# Patient Record
Sex: Female | Born: 1980 | Race: Black or African American | Hispanic: No | Marital: Married | Smoking: Never smoker
Health system: Southern US, Community
[De-identification: ages and names within clinical notes are randomized; demographics above are authoritative.]

## PROBLEM LIST (undated history)

## (undated) ENCOUNTER — Inpatient Hospital Stay (HOSPITAL_COMMUNITY): Payer: Self-pay

## (undated) DIAGNOSIS — O139 Gestational [pregnancy-induced] hypertension without significant proteinuria, unspecified trimester: Secondary | ICD-10-CM

## (undated) DIAGNOSIS — O09899 Supervision of other high risk pregnancies, unspecified trimester: Secondary | ICD-10-CM

## (undated) DIAGNOSIS — D219 Benign neoplasm of connective and other soft tissue, unspecified: Secondary | ICD-10-CM

## (undated) HISTORY — PX: WISDOM TOOTH EXTRACTION: SHX21

---

## 2012-09-20 ENCOUNTER — Ambulatory Visit (INDEPENDENT_AMBULATORY_CARE_PROVIDER_SITE_OTHER): Payer: BC Managed Care – PPO | Admitting: Family Medicine

## 2012-09-20 VITALS — BP 140/72 | HR 80 | Temp 98.9°F | Resp 18 | Ht 66.0 in | Wt 186.0 lb

## 2012-09-20 DIAGNOSIS — L293 Anogenital pruritus, unspecified: Secondary | ICD-10-CM

## 2012-09-20 DIAGNOSIS — N898 Other specified noninflammatory disorders of vagina: Secondary | ICD-10-CM

## 2012-09-20 LAB — POCT WET PREP WITH KOH
Clue Cells Wet Prep HPF POC: 100
KOH Prep POC: POSITIVE
Trichomonas, UA: NEGATIVE
Yeast Wet Prep HPF POC: NEGATIVE

## 2012-09-20 MED ORDER — FLUCONAZOLE 150 MG PO TABS: 150.0000 mg | ORAL_TABLET | Freq: Once | ORAL | Status: DC

## 2012-09-20 NOTE — Progress Notes (Signed)
31 yo married, monogamous woman with vaginal itching x 2 days.  Having a milky type discharge without odor.  LMP ended 12/15 G1P1  Objective:  NAD Vaginal exam:  White lumpy vaginal discharge, normal cervix without motion tenderness Results for orders placed in visit on 09/20/12  POCT WET PREP WITH KOH      Component Value Range   Trichomonas, UA Negative     Clue Cells Wet Prep HPF POC 100%     Epithelial Wet Prep HPF POC 0-8     Yeast Wet Prep HPF POC neg     Bacteria Wet Prep HPF POC 4+     RBC Wet Prep HPF POC 0-1     WBC Wet Prep HPF POC 0-1     KOH Prep POC Positive      1. Vaginal itching  POCT Wet Prep with KOH

## 2012-09-20 NOTE — Patient Instructions (Addendum)
Monilial Vaginitis Vaginitis in a soreness, swelling and redness (inflammation) of the vagina and vulva. Monilial vaginitis is not a sexually transmitted infection. CAUSES  Yeast vaginitis is caused by yeast (candida) that is normally found in your vagina. With a yeast infection, the candida has overgrown in number to a point that upsets the chemical balance. SYMPTOMS   White, thick vaginal discharge.  Swelling, itching, redness and irritation of the vagina and possibly the lips of the vagina (vulva).  Burning or painful urination.  Painful intercourse. DIAGNOSIS  Things that may contribute to monilial vaginitis are:  Postmenopausal and virginal states.  Pregnancy.  Infections.  Being tired, sick or stressed, especially if you had monilial vaginitis in the past.  Diabetes. Good control will help lower the chance.  Birth control pills.  Tight fitting garments.  Using bubble bath, feminine sprays, douches or deodorant tampons.  Taking certain medications that kill germs (antibiotics).  Sporadic recurrence can occur if you become ill. TREATMENT  Your caregiver will give you medication.  There are several kinds of anti monilial vaginal creams and suppositories specific for monilial vaginitis. For recurrent yeast infections, use a suppository or cream in the vagina 2 times a week, or as directed.  Anti-monilial or steroid cream for the itching or irritation of the vulva may also be used. Get your caregiver's permission.  Painting the vagina with methylene blue solution may help if the monilial cream does not work.  Eating yogurt may help prevent monilial vaginitis. HOME CARE INSTRUCTIONS   Finish all medication as prescribed.  Do not have sex until treatment is completed or after your caregiver tells you it is okay.  Take warm sitz baths.  Do not douche.  Do not use tampons, especially scented ones.  Wear cotton underwear.  Avoid tight pants and panty  hose.  Tell your sexual partner that you have a yeast infection. They should go to their caregiver if they have symptoms such as mild rash or itching.  Your sexual partner should be treated as well if your infection is difficult to eliminate.  Practice safer sex. Use condoms.  Some vaginal medications cause latex condoms to fail. Vaginal medications that harm condoms are:  Cleocin cream.  Butoconazole (Femstat).  Terconazole (Terazol) vaginal suppository.  Miconazole (Monistat) (may be purchased over the counter). SEEK MEDICAL CARE IF:   You have a temperature by mouth above 102 F (38.9 C).  The infection is getting worse after 2 days of treatment.  The infection is not getting better after 3 days of treatment.  You develop blisters in or around your vagina.  You develop vaginal bleeding, and it is not your menstrual period.  You have pain when you urinate.  You develop intestinal problems.  You have pain with sexual intercourse. Document Released: 07/01/2005 Document Revised: 12/14/2011 Document Reviewed: 03/15/2009 [employer] Patient Information 2013 [employer].  

## 2012-12-14 ENCOUNTER — Ambulatory Visit (INDEPENDENT_AMBULATORY_CARE_PROVIDER_SITE_OTHER): Payer: BC Managed Care – PPO | Admitting: Physician Assistant

## 2012-12-14 DIAGNOSIS — M25571 Pain in right ankle and joints of right foot: Secondary | ICD-10-CM

## 2012-12-14 DIAGNOSIS — S93409A Sprain of unspecified ligament of unspecified ankle, initial encounter: Secondary | ICD-10-CM

## 2012-12-14 MED ORDER — MELOXICAM 15 MG PO TABS: 15.0000 mg | ORAL_TABLET | Freq: Every day | ORAL | Status: DC

## 2012-12-14 NOTE — Progress Notes (Signed)
  Subjective:    Patient ID: Bridget Morales, female    DOB: Oct 23, 1980, 32 y.o.   MRN: [id number]  HPI   Bridget Morales is a very pleasant 32 yr old female with right ankle pain after playing kickball last night.  She does not remember injuring the ankle, but woke up around 1:45 this morning with pain in her foot and ankle.  Couldn't put pressure on the foot, didn't even want anything to touch it.  Feels like it is getting a little better now.  The right ankle is a little swollen, but no redness or bruising.  No ankle problems before.  No history of gout.  She is able to bear weight.  Pain is on the lateral aspect of the ankle.  Feels "tight" in her shin.  No knee pain.  Has not done anything for symptoms yet.     Review of Systems  Constitutional: Negative for fever and chills.  HENT: Negative.   Respiratory: Negative.   Cardiovascular: Negative.   Gastrointestinal: Negative.   Musculoskeletal: Positive for arthralgias (right ankle).  Skin: Negative.   Neurological: Negative.        Objective:   Physical Exam  Vitals reviewed. Constitutional: She is oriented to person, place, and time. She appears well-developed and well-nourished. No distress.  HENT:  Head: Normocephalic and atraumatic.  Eyes: Conjunctivae are normal. No scleral icterus.  Pulmonary/Chest: Effort normal.  Musculoskeletal:       Right knee: Normal.       Right ankle: She exhibits decreased range of motion and swelling (minimal). She exhibits no ecchymosis and no deformity. Tenderness. AITFL and CF ligament tenderness found. No lateral malleolus, no medial malleolus, no head of 5th metatarsal and no proximal fibula tenderness found. Achilles tendon normal.       Left ankle: Normal.       Right foot: She exhibits normal range of motion, no tenderness and no bony tenderness.       Left foot: Normal.  Neurological: She is alert and oriented to person, place, and time.  Skin: Skin is warm and dry.  Psychiatric: She has a  normal mood and affect.     Filed Vitals:   12/14/12 1020  BP: 130/92  Pulse: 89  Temp: 98.8 F (37.1 C)  Resp: 18        Assessment & Plan:  Ankle sprain and strain, right, initial encounter - Plan: meloxicam (MOBIC) 15 MG tablet  -- Suspect Grade I, possibly Grade II sprain of right ankle.  She is able to bear weight.  There is no tenderness over either malleolus or the 5th metatarsal.  Because of this, I have not done x-rays.  Will treat with rest, ice, compression, and elevation.  Ankle wrapped with ace.  Mobic daily for the next 1-2 weeks.  Discussed beginning gentle strengthening and ROM exercises in 2-3 days - gave pt illustrations and theraband. If worsening pt will let us know.  If no improvement in 1 week, pt will RTC.  Discussed with pt that full recovery will likely take several weeks, but I am hopeful she will begin seeing gradual improvement.    Ankle pain, right - Plan: meloxicam (MOBIC) 15 MG tablet  -- Mobic daily.  Tylenol if needed for additional pain relief.

## 2012-12-14 NOTE — Patient Instructions (Addendum)
Begin taking the Mobic (meloxicam) once daily for at least the next week - you may continue for 2-3 weeks if needed for pain relief.  You may use Tylenol for breakthrough pain, do not use additional ibuprofen or aleve.  Use the ACE wrap for the next week.  Weight bear as tolerated.  Ice for 15-20 minutes at a time, 3-4 times per day.  Elevate when possible.     Ankle Sprain An ankle sprain is an injury to the strong, fibrous tissues (ligaments) that hold the bones of your ankle joint together.  CAUSES An ankle sprain is usually caused by a fall or by twisting your ankle. Ankle sprains most commonly occur when you step on the outer edge of your foot, and your ankle turns inward. People who participate in sports are more prone to these types of injuries.  SYMPTOMS   Pain in your ankle. The pain may be present at rest or only when you are trying to stand or walk.  Swelling.  Bruising. Bruising may develop immediately or within 1 to 2 days after your injury.  Difficulty standing or walking, particularly when turning corners or changing directions. DIAGNOSIS  Your caregiver will ask you details about your injury and perform a physical exam of your ankle to determine if you have an ankle sprain. During the physical exam, your caregiver will press on and apply pressure to specific areas of your foot and ankle. Your caregiver will try to move your ankle in certain ways. An X-ray exam may be done to be sure a bone was not broken or a ligament did not separate from one of the bones in your ankle (avulsion fracture).  TREATMENT  Certain types of braces can help stabilize your ankle. Your caregiver can make a recommendation for this. Your caregiver may recommend the use of medicine for pain. If your sprain is severe, your caregiver may refer you to a surgeon who helps to restore function to parts of your skeletal system (orthopedist) or a physical therapist. HOME CARE INSTRUCTIONS   Apply ice to your  injury for 1 to 2 days or as directed by your caregiver. Applying ice helps to reduce inflammation and pain.  Put ice in a plastic bag.  Place a towel between your skin and the bag.  Leave the ice on for 15 to 20 minutes at a time, every 2 hours while you are awake.  Only take over-the-counter or prescription medicines for pain, discomfort, or fever as directed by your caregiver.  Keep your injured leg elevated, when possible, to lessen swelling.  If your caregiver recommends crutches, use them as instructed. Gradually put weight on the affected ankle. Continue to use crutches or a cane until you can walk without feeling pain in your ankle.  If you have a plaster splint, wear the splint as directed by your caregiver. Do not rest it on anything harder than a pillow for the first 24 hours. Do not put weight on it. Do not get it wet. You may take it off to take a shower or bath.  You may have been given an elastic bandage to wear around your ankle to provide support. If the elastic bandage is too tight (you have numbness or tingling in your foot or your foot becomes cold and blue), adjust the bandage to make it comfortable.  If you have an air splint, you may blow more air into it or let air out to make it more comfortable. You may take  your splint off at night and before taking a shower or bath.  Wiggle your toes in the splint several times per day to decrease swelling. SEEK MEDICAL CARE IF:   You have an increase in bruising, swelling, or pain.  Your toes feel extremely cold or you lose feeling in your foot.  Your pain is not relieved with medicine. SEEK IMMEDIATE MEDICAL CARE IF:  Your toes are numb or blue.  You have severe pain. MAKE SURE YOU:   Understand these instructions.  Will watch your condition.  Will get help right away if you are not doing well or get worse. Document Released: 09/21/2005 Document Revised: 12/14/2011 Document Reviewed: 10/03/2011 [facility] Patient  Information 2013 [place], [place].

## 2013-07-11 LAB — OB RESULTS CONSOLE GC/CHLAMYDIA
Chlamydia: NEGATIVE
Gonorrhea: NEGATIVE

## 2013-08-10 LAB — OB RESULTS CONSOLE HIV ANTIBODY (ROUTINE TESTING): HIV: NONREACTIVE

## 2013-08-10 LAB — OB RESULTS CONSOLE ABO/RH: RH TYPE: POSITIVE

## 2013-08-10 LAB — OB RESULTS CONSOLE HEPATITIS B SURFACE ANTIGEN: Hepatitis B Surface Ag: NEGATIVE

## 2013-08-10 LAB — OB RESULTS CONSOLE RUBELLA ANTIBODY, IGM: Rubella: IMMUNE

## 2013-08-10 LAB — OB RESULTS CONSOLE RPR: RPR: NONREACTIVE

## 2013-08-10 LAB — OB RESULTS CONSOLE ANTIBODY SCREEN: Antibody Screen: NEGATIVE

## 2013-10-05 ENCOUNTER — Ambulatory Visit: Payer: BC Managed Care – PPO

## 2013-10-05 NOTE — L&D Delivery Note (Signed)
Pt was admitted yesterday for induction. At the time her cx was closed. She was started on MgSO4 and Cytotec. After two doses she was started on Pit. She had SROM this am. She rapidly completed the first stage without difficulty. She was found to be C/C/+3 and involuntarily pushed out a live viable black female in the ROA position over an intact perineum. Placenta-S/I. EBL-400cc. Baby to NBN.

## 2013-10-17 ENCOUNTER — Inpatient Hospital Stay (HOSPITAL_COMMUNITY)
Admission: AD | Admit: 2013-10-17 | Discharge: 2013-10-18 | Disposition: A | Discharge status: Home / Self Care | Payer: BC Managed Care – PPO | Source: Ambulatory Visit | Attending: Obstetrics and Gynecology | Admitting: Obstetrics and Gynecology

## 2013-10-17 ENCOUNTER — Encounter (HOSPITAL_COMMUNITY): Payer: Self-pay | Admitting: *Deleted

## 2013-10-17 DIAGNOSIS — N949 Unspecified condition associated with female genital organs and menstrual cycle: Secondary | ICD-10-CM

## 2013-10-17 DIAGNOSIS — O99891 Other specified diseases and conditions complicating pregnancy: Secondary | ICD-10-CM | POA: Insufficient documentation

## 2013-10-17 DIAGNOSIS — O9989 Other specified diseases and conditions complicating pregnancy, childbirth and the puerperium: Principal | ICD-10-CM

## 2013-10-17 DIAGNOSIS — R109 Unspecified abdominal pain: Secondary | ICD-10-CM | POA: Insufficient documentation

## 2013-10-17 LAB — URINALYSIS, ROUTINE W REFLEX MICROSCOPIC
BILIRUBIN URINE: NEGATIVE
GLUCOSE, UA: NEGATIVE mg/dL
KETONES UR: NEGATIVE mg/dL
Leukocytes, UA: NEGATIVE
Nitrite: NEGATIVE
PH: 7 (ref 5.0–8.0)
Protein, ur: NEGATIVE mg/dL
Specific Gravity, Urine: 1.01 (ref 1.005–1.030)
Urobilinogen, UA: 0.2 mg/dL (ref 0.0–1.0)

## 2013-10-17 LAB — URINE MICROSCOPIC-ADD ON: WBC, UA: NONE SEEN WBC/hpf (ref <3)

## 2013-10-17 NOTE — MAU Note (Signed)
PT SAYS SHE HAS BEEN  HAVING LOWER ABD PAIN THAT ROTATES TO HER BACK -- STARTED  4 PM .   LAST SEEN IN OFFICE - LAST Friday- ALL OK. LAST SEX- 2 WEEKS AGO.     DENIES HSV AND MRSA.

## 2013-10-17 NOTE — MAU Provider Note (Signed)
History     CSN: [id number]  Arrival date and time: 10/17/13 2303   First Provider Initiated Contact with Patient 10/17/13 2347      No chief complaint on file.  HPI  Bridget Morales is a 33 y.o. G3P2 at [redacted]w[redacted]d who presents today with with lower abdominal pain, and cramping. She denies any bleeding or LOF, and confirms fetal movement.   Past Medical History  Diagnosis Date  . Medical history non-contributory     Past Surgical History  Procedure Laterality Date  . No past surgeries      Family History  Problem Relation Age of Onset  . Diabetes Father   . Diabetes Maternal Grandmother     History  Substance Use Topics  . Smoking status: Never Smoker   . Smokeless tobacco: Not on file  . Alcohol Use: No    Allergies: No Known Allergies  Prescriptions prior to admission  Medication Sig Dispense Refill  . meloxicam (MOBIC) 15 MG tablet Take 1 tablet (15 mg total) by mouth daily.  30 tablet  1    ROS Physical Exam   Blood pressure 161/90, pulse 104, temperature 98.5 F (36.9 C), temperature source Oral, resp. rate 18, height 5\' 6"  (1.676 m), weight 95.879 kg (211 lb 6 oz), last menstrual period 12/07/2012.  Physical Exam  Nursing note and vitals reviewed. Constitutional: She is oriented to person, place, and time. She appears well-developed and well-nourished. No distress.  Cardiovascular: Normal rate.   Respiratory: Effort normal.  GI: Soft. There is no tenderness.  Genitourinary:   External: no lesion Vagina: small amount of white discharge Cervix: pink, smooth, closed/thick Uterus: AGA, fht with doppler  Toco: no UCs or UI     Neurological: She is alert and oriented to person, place, and time.  Skin: Skin is warm and dry.  Psychiatric: She has a normal mood and affect.    MAU Course  Procedures  Results for orders placed during the hospital encounter of 10/17/13 (from the past 24 hour(s))  URINALYSIS, ROUTINE W REFLEX MICROSCOPIC     Status:  Abnormal   Collection Time    10/17/13 11:05 PM      Result Value Range   Color, Urine YELLOW  YELLOW   APPearance CLEAR  CLEAR   Specific Gravity, Urine 1.010  1.005 - 1.030   pH 7.0  5.0 - 8.0   Glucose, UA NEGATIVE  NEGATIVE mg/dL   Hgb urine dipstick SMALL (*) NEGATIVE   Bilirubin Urine NEGATIVE  NEGATIVE   Ketones, ur NEGATIVE  NEGATIVE mg/dL   Protein, ur NEGATIVE  NEGATIVE mg/dL   Urobilinogen, UA 0.2  0.0 - 1.0 mg/dL   Nitrite NEGATIVE  NEGATIVE   Leukocytes, UA NEGATIVE  NEGATIVE  URINE MICROSCOPIC-ADD ON     Status: Abnormal   Collection Time    10/17/13 11:05 PM      Result Value Range   Squamous Epithelial / LPF RARE  RARE   WBC, UA    <3 WBC/hpf   Value: NO FORMED ELEMENTS SEEN ON URINE MICROSCOPIC EXAMINATION   RBC / HPF 3-6  <3 RBC/hpf   Bacteria, UA FEW (*) RARE  WET PREP, GENITAL     Status: Abnormal   Collection Time    10/17/13 11:50 PM      Result Value Range   Yeast Wet Prep HPF POC NONE SEEN  NONE SEEN   Trich, Wet Prep NONE SEEN  NONE SEEN   Clue Cells Wet Prep  HPF POC NONE SEEN  NONE SEEN   WBC, Wet Prep HPF POC MODERATE (*) NONE SEEN     Assessment and Plan   1. Round ligament pain    PTL precautions Return to MAU as needed  Follow-up Information   Follow up with HORVATH,MICHELLE A, MD. (as scheduled )    Specialty:  Obstetrics and Gynecology   Contact information:   [address] [place] [id number] [phone]       Mathis Bud 10/17/2013, 11:53 PM

## 2013-10-18 DIAGNOSIS — N949 Unspecified condition associated with female genital organs and menstrual cycle: Secondary | ICD-10-CM

## 2013-10-18 LAB — WET PREP, GENITAL
CLUE CELLS WET PREP: NONE SEEN
TRICH WET PREP: NONE SEEN
Yeast Wet Prep HPF POC: NONE SEEN

## 2013-10-18 NOTE — Discharge Instructions (Signed)
Second Trimester of Pregnancy The second trimester is from week 13 through week 28, months 4 through 6. The second trimester is often a time when you feel your best. Your body has also adjusted to being pregnant, and you begin to feel better physically. Usually, morning sickness has lessened or quit completely, you may have more energy, and you may have an increase in appetite. The second trimester is also a time when the fetus is growing rapidly. At the end of the sixth month, the fetus is about 9 inches long and weighs about 1 pounds. You will likely begin to feel the baby move (quickening) between 18 and 20 weeks of the pregnancy. BODY CHANGES Your body goes through many changes during pregnancy. The changes vary from woman to woman.   Your weight will continue to increase. You will notice your lower abdomen bulging out.  You may begin to get stretch marks on your hips, abdomen, and breasts.  You may develop headaches that can be relieved by medicines approved by your caregiver.  You may urinate more often because the fetus is pressing on your bladder.  You may develop or continue to have heartburn as a result of your pregnancy.  You may develop constipation because certain hormones are causing the muscles that push waste through your intestines to slow down.  You may develop hemorrhoids or swollen, bulging veins (varicose veins).  You may have back pain because of the weight gain and pregnancy hormones relaxing your joints between the bones in your pelvis and as a result of a shift in weight and the muscles that support your balance.  Your breasts will continue to grow and be tender.  Your gums may bleed and may be sensitive to brushing and flossing.  Dark spots or blotches (chloasma, mask of pregnancy) may develop on your face. This will likely fade after the baby is born.  A dark line from your belly button to the pubic area (linea nigra) may appear. This will likely fade after the  baby is born. WHAT TO EXPECT AT YOUR PRENATAL VISITS During a routine prenatal visit:  You will be weighed to make sure you and the fetus are growing normally.  Your blood pressure will be taken.  Your abdomen will be measured to track your baby's growth.  The fetal heartbeat will be listened to.  Any test results from the previous visit will be discussed. Your caregiver may ask you:  How you are feeling.  If you are feeling the baby move.  If you have had any abnormal symptoms, such as leaking fluid, bleeding, severe headaches, or abdominal cramping.  If you have any questions. Other tests that may be performed during your second trimester include:  Blood tests that check for:  Low iron levels (anemia).  Gestational diabetes (between 24 and 28 weeks).  Rh antibodies.  Urine tests to check for infections, diabetes, or protein in the urine.  An ultrasound to confirm the proper growth and development of the baby.  An amniocentesis to check for possible genetic problems.  Fetal screens for spina bifida and Down syndrome. HOME CARE INSTRUCTIONS   Avoid all smoking, herbs, alcohol, and unprescribed drugs. These chemicals affect the formation and growth of the baby.  Follow your caregiver's instructions regarding medicine use. There are medicines that are either safe or unsafe to take during pregnancy.  Exercise only as directed by your caregiver. Experiencing uterine cramps is a good sign to stop exercising.  Continue to eat regular,   healthy meals.  Wear a good support bra for breast tenderness.  Do not use hot tubs, steam rooms, or saunas.  Wear your seat belt at all times when driving.  Avoid raw meat, uncooked cheese, cat litter boxes, and soil used by cats. These carry germs that can cause birth defects in the baby.  Take your prenatal vitamins.  Try taking a stool softener (if your caregiver approves) if you develop constipation. Eat more high-fiber foods,  such as fresh vegetables or fruit and whole grains. Drink plenty of fluids to keep your urine clear or pale yellow.  Take warm sitz baths to soothe any pain or discomfort caused by hemorrhoids. Use hemorrhoid cream if your caregiver approves.  If you develop varicose veins, wear support hose. Elevate your feet for 15 minutes, 3 4 times a day. Limit salt in your diet.  Avoid heavy lifting, wear low heel shoes, and practice good posture.  Rest with your legs elevated if you have leg cramps or low back pain.  Visit your dentist if you have not gone yet during your pregnancy. Use a soft toothbrush to brush your teeth and be gentle when you floss.  A sexual relationship may be continued unless your caregiver directs you otherwise.  Continue to go to all your prenatal visits as directed by your caregiver. SEEK MEDICAL CARE IF:   You have dizziness.  You have mild pelvic cramps, pelvic pressure, or nagging pain in the abdominal area.  You have persistent nausea, vomiting, or diarrhea.  You have a bad smelling vaginal discharge.  You have pain with urination. SEEK IMMEDIATE MEDICAL CARE IF:   You have a fever.  You are leaking fluid from your vagina.  You have spotting or bleeding from your vagina.  You have severe abdominal cramping or pain.  You have rapid weight gain or loss.  You have shortness of breath with chest pain.  You notice sudden or extreme swelling of your face, hands, ankles, feet, or legs.  You have not felt your baby move in over an hour.  You have severe headaches that do not go away with medicine.  You have vision changes. Document Released: 09/15/2001 Document Revised: 05/24/2013 Document Reviewed: 11/22/2012 [employer] Patient Information 2014 [employer].  

## 2013-12-19 ENCOUNTER — Inpatient Hospital Stay (HOSPITAL_COMMUNITY)
Admission: AD | Admit: 2013-12-19 | Discharge: 2013-12-21 | DRG: 781 | Disposition: A | Discharge status: Home / Self Care | Payer: BC Managed Care – PPO | Source: Ambulatory Visit | Attending: Obstetrics and Gynecology | Admitting: Obstetrics and Gynecology

## 2013-12-19 ENCOUNTER — Encounter (HOSPITAL_COMMUNITY): Payer: Self-pay | Admitting: *Deleted

## 2013-12-19 DIAGNOSIS — O10019 Pre-existing essential hypertension complicating pregnancy, unspecified trimester: Principal | ICD-10-CM | POA: Diagnosis present

## 2013-12-19 DIAGNOSIS — O409XX0 Polyhydramnios, unspecified trimester, not applicable or unspecified: Secondary | ICD-10-CM | POA: Diagnosis present

## 2013-12-19 DIAGNOSIS — O139 Gestational [pregnancy-induced] hypertension without significant proteinuria, unspecified trimester: Secondary | ICD-10-CM | POA: Diagnosis present

## 2013-12-19 HISTORY — DX: Supervision of other high risk pregnancies, unspecified trimester: O09.899

## 2013-12-19 LAB — COMPREHENSIVE METABOLIC PANEL
ALBUMIN: 2.7 g/dL — AB (ref 3.5–5.2)
ALK PHOS: 99 U/L (ref 39–117)
ALT: 8 U/L (ref 0–35)
AST: 11 U/L (ref 0–37)
BUN: 5 mg/dL — AB (ref 6–23)
CO2: 21 mEq/L (ref 19–32)
Calcium: 9 mg/dL (ref 8.4–10.5)
Chloride: 104 mEq/L (ref 96–112)
Creatinine, Ser: 0.4 mg/dL — ABNORMAL LOW (ref 0.50–1.10)
GFR calc Af Amer: >90 mL/min (ref >90)
GFR calc non Af Amer: >90 mL/min (ref >90)
Glucose, Bld: 90 mg/dL (ref 70–99)
POTASSIUM: 3.6 meq/L — AB (ref 3.7–5.3)
SODIUM: 138 meq/L (ref 137–147)
TOTAL PROTEIN: 6.6 g/dL (ref 6.0–8.3)
Total Bilirubin: <0.2 mg/dL — ABNORMAL LOW (ref 0.3–1.2)

## 2013-12-19 LAB — PROTEIN / CREATININE RATIO, URINE
Creatinine, Urine: 61.34 mg/dL
Protein Creatinine Ratio: 0.17 — ABNORMAL HIGH (ref 0.00–0.15)
Total Protein, Urine: 10.6 mg/dL

## 2013-12-19 LAB — CBC
HEMATOCRIT: 35.7 % — AB (ref 36.0–46.0)
Hemoglobin: 12 g/dL (ref 12.0–15.0)
MCH: 27.8 pg (ref 26.0–34.0)
MCHC: 33.6 g/dL (ref 30.0–36.0)
MCV: 82.6 fL (ref 78.0–100.0)
Platelets: 327 10*3/uL (ref 150–400)
RBC: 4.32 MIL/uL (ref 3.87–5.11)
RDW: 13.9 % (ref 11.5–15.5)
WBC: 12.6 10*3/uL — ABNORMAL HIGH (ref 4.0–10.5)

## 2013-12-19 LAB — URINALYSIS, ROUTINE W REFLEX MICROSCOPIC
Bilirubin Urine: NEGATIVE
Glucose, UA: NEGATIVE mg/dL
KETONES UR: NEGATIVE mg/dL
NITRITE: NEGATIVE
Protein, ur: NEGATIVE mg/dL
Specific Gravity, Urine: <1.005 — ABNORMAL LOW (ref 1.005–1.030)
Urobilinogen, UA: 0.2 mg/dL (ref 0.0–1.0)
pH: 6.5 (ref 5.0–8.0)

## 2013-12-19 LAB — URINE MICROSCOPIC-ADD ON

## 2013-12-19 MED ORDER — ACETAMINOPHEN 325 MG PO TABS: 650.0000 mg | ORAL_TABLET | ORAL | Status: DC | PRN

## 2013-12-19 MED ORDER — PRENATAL MULTIVITAMIN CH
1.0000 | ORAL_TABLET | Freq: Every day | ORAL | Status: DC
  Administered 2013-12-20 – 2013-12-21 (×2): 1 via ORAL
  Filled 2013-12-19 (×2): qty 1

## 2013-12-19 MED ORDER — DOCUSATE SODIUM 100 MG PO CAPS
100.0000 mg | ORAL_CAPSULE | Freq: Every day | ORAL | Status: DC
  Filled 2013-12-19 (×2): qty 1

## 2013-12-19 MED ORDER — LABETALOL HCL 100 MG PO TABS
100.0000 mg | ORAL_TABLET | Freq: Two times a day (BID) | ORAL | Status: DC
  Administered 2013-12-19: 100 mg via ORAL
  Filled 2013-12-19: qty 1

## 2013-12-19 MED ORDER — CALCIUM CARBONATE ANTACID 500 MG PO CHEW: 2.0000 | CHEWABLE_TABLET | ORAL | Status: DC | PRN

## 2013-12-19 MED ORDER — LABETALOL HCL 300 MG PO TABS
300.0000 mg | ORAL_TABLET | Freq: Two times a day (BID) | ORAL | Status: DC
  Administered 2013-12-20 – 2013-12-21 (×3): 300 mg via ORAL
  Filled 2013-12-19 (×2): qty 1
  Filled 2013-12-19: qty 3

## 2013-12-19 MED ORDER — ZOLPIDEM TARTRATE 5 MG PO TABS: 5.0000 mg | ORAL_TABLET | Freq: Every evening | ORAL | Status: DC | PRN

## 2013-12-19 MED ORDER — LABETALOL HCL 200 MG PO TABS
200.0000 mg | ORAL_TABLET | Freq: Once | ORAL | Status: AC
  Administered 2013-12-19: 200 mg via ORAL
  Filled 2013-12-19: qty 1

## 2013-12-19 MED ORDER — BETAMETHASONE SOD PHOS & ACET 6 (3-3) MG/ML IJ SUSP
12.0000 mg | INTRAMUSCULAR | Status: AC
  Administered 2013-12-19 – 2013-12-20 (×2): 12 mg via INTRAMUSCULAR
  Filled 2013-12-19 (×2): qty 2

## 2013-12-19 NOTE — Consult Note (Signed)
Asked by Dr.Callahan to provide prenatal consultation for patient at risk for preterm delivery due to [facility].  Mother is 33 y.o. G3 P2 who is now 29 5/[redacted] weeks EGA, with chronic hypertension and superimposed pregnancy-induced hypertension.  She was admitted today and is undergoing 24-hour urine and other PIH labs, and she is being treated with betamethasone (first dose today).  Discussed with patient and her husband usual expectations for preterm infant at 29+ weeks gestation, including possible needs for DR resuscitation, respiratory support, IV access, and blood products.  Presented optimistic outcome with low risk of death or serious morbidity.  Projected possible length of stay in NICU until 36 - [redacted] wks EGA.  Discussed advantages of feeding with mother's milk.  She plans to pump postnatally.  Patient and husband were attentive, had appropriate questions, and were appreciative of my input.  Thank you for the consultation.  Total time 25 minutes Face-t-face time 20 minutes

## 2013-12-19 NOTE — H&P (Addendum)
33 y.o. [redacted]w[redacted]d  G3P2 comes in from office with BPs 190s/100s, asymptomatic, and noncompliant with blood pressure medications.  Otherwise has good fetal movement and no bleeding.  Past Medical History  Diagnosis Date  . Medical history non-contributory   . Previous pregnancy complicated by pregnancy-induced hypertension, antepartum     Past Surgical History  Procedure Laterality Date  . No past surgeries      OB History  Gravida Para Term Preterm AB SAB TAB Ectopic Multiple Living  3 2        2     # Outcome Date GA Lbr Len/2nd Weight Sex Delivery Anes PTL Lv  3 CUR           2 PAR     F SVD   Y  1 PAR     F SVD   Y      History   Social History  . Marital Status: Married    Spouse Name: N/A    Number of Children: N/A  . Years of Education: N/A   Occupational History  . Not on file.   Social History Main Topics  . Smoking status: Never Smoker   . Smokeless tobacco: Not on file  . Alcohol Use: No  . Drug Use: No  . Sexual Activity: Yes    Birth Control/ Protection: IUD   Other Topics Concern  . Not on file   Social History Narrative  . No narrative on file   Review of patient's allergies indicates no known allergies.     Filed Vitals:   12/19/13 1803  BP: 169/112  Pulse: 120     Lungs/Cor:  NAD Abdomen:  soft, gravid Ex:  no cords, erythema SVE: def FHTs:  148 doppler   A/P   Uncontrolled CHTN r/o superimposed preeclampsia, non-compliant with treatment recommendations 1) s/p MFM consult with Dr. Venetia Maxon, he will formally see her tomorrow.  Recommendations: course of steroids, 24 urine protein with CBC/CMP, start labetalol 300bid, with increased to tid if needed.  2) Cont montoring for now.  3) Pt's husband with concerns about betamethasone, will obtain NICU consult.  Allyn Kenner

## 2013-12-19 NOTE — MAU Note (Signed)
Pt reports B/p was elevated at doctors office. 190/?Denies  H/A or visual changes. No pain Good fetal movement felt

## 2013-12-19 NOTE — Plan of Care (Signed)
Problem: Consults Goal: Birthing Suites Patient Information Press F2 to bring up selections list Outcome: Completed/Met Date Met:  12/19/13  Pt < [redacted] weeks EGA

## 2013-12-20 ENCOUNTER — Inpatient Hospital Stay (HOSPITAL_COMMUNITY): Payer: BC Managed Care – PPO

## 2013-12-20 ENCOUNTER — Ambulatory Visit (HOSPITAL_COMMUNITY): Payer: BC Managed Care – PPO

## 2013-12-20 ENCOUNTER — Other Ambulatory Visit (HOSPITAL_COMMUNITY): Payer: Managed Care, Other (non HMO)

## 2013-12-20 LAB — CBC
HCT: 36.5 % (ref 36.0–46.0)
Hemoglobin: 12.1 g/dL (ref 12.0–15.0)
MCH: 27.8 pg (ref 26.0–34.0)
MCHC: 33.2 g/dL (ref 30.0–36.0)
MCV: 83.7 fL (ref 78.0–100.0)
Platelets: 312 10*3/uL (ref 150–400)
RBC: 4.36 MIL/uL (ref 3.87–5.11)
RDW: 14.1 % (ref 11.5–15.5)
WBC: 14.4 10*3/uL — AB (ref 4.0–10.5)

## 2013-12-20 LAB — CREATININE CLEARANCE, URINE, 24 HOUR
CREAT CLEAR: 247 mL/min — AB (ref 75–115)
CREATININE 24H UR: 1425 mg/d (ref 700–1800)
CREATININE, URINE: 82.63 mg/dL
CREATININE: 0.4 mg/dL — AB (ref 0.50–1.10)
Collection Interval-CRCL: 24 hours
Urine Total Volume-CRCL: 1725 mL

## 2013-12-20 MED ORDER — NIFEDIPINE 10 MG PO CAPS
10.0000 mg | ORAL_CAPSULE | Freq: Four times a day (QID) | ORAL | Status: DC | PRN
  Administered 2013-12-21: 10 mg via ORAL
  Filled 2013-12-20 (×2): qty 1

## 2013-12-20 MED ORDER — SODIUM CHLORIDE 0.9 % IJ SOLN
3.0000 mL | Freq: Two times a day (BID) | INTRAMUSCULAR | Status: DC
  Administered 2013-12-20: 3 mL via INTRAVENOUS

## 2013-12-20 NOTE — Progress Notes (Signed)
Ur chart review completed.  

## 2013-12-20 NOTE — Progress Notes (Signed)
Pt. To MFM via w/c

## 2013-12-20 NOTE — Consult Note (Signed)
Maternal Fetal Medicine Consultation  Requesting Provider(s): Bobbye Charleston, MD  Reason for consultation: Poorly controlled hypertension  HPI: Bridget Morales is a 33 year-old G3P1102 currently at [redacted]w[redacted]d, EDD 03/01/2014 who was admitted due to severe range blood pressures - in the 190's/100's in clinic yesterday.  The patient was previously given a prescription for Labetalol 100 mg BID which she never had filled.  The patient reports a history of a prior induction of labor at 36 weeks due to preeclampsia.  She was treated with blood pressure medications for several months after delivery after which her PCM stopped medications.  She otherwise denies a history of Chronic hypertension.  Her intake blood pressures where 140-150/80-90 range.    The patient is currently asymptomatic - she denies headache, visual changes or right upper quadrant pain.  Her prenatal course has otherwise been uncomplicated.  The patient was admitted yesterday for 24-hr urine protein, a course of betamethasone and observation.  She was started on Labetalol 300 mg BID last night.  OB History: OB History   Grav Para Term Preterm Abortions TAB SAB Ect Mult Living   3 2        2       PMH:  Past Medical History  Diagnosis Date  . Medical history non-contributory   . Previous pregnancy complicated by pregnancy-induced hypertension, antepartum     PSH:  Past Surgical History  Procedure Laterality Date  . No past surgeries     Meds:  Scheduled Meds: . betamethasone acetate-betamethasone sodium phosphate  12 mg Intramuscular Q24H  . docusate sodium  100 mg Oral Daily  . labetalol  300 mg Oral BID  . prenatal multivitamin  1 tablet Oral Q1200   Continuous Infusions:  PRN Meds:.acetaminophen, calcium carbonate, NIFEdipine, zolpidem  Allergies: No Known Allergies  FH:  Family History  Problem Relation Age of Onset  . Diabetes Father   . Diabetes Maternal Grandmother    Soc:  History   Social History  .  Marital Status: Married    Spouse Name: N/A    Number of Children: N/A  . Years of Education: N/A   Occupational History  . Not on file.   Social History Main Topics  . Smoking status: Never Smoker   . Smokeless tobacco: Never Used  . Alcohol Use: No  . Drug Use: No  . Sexual Activity: Yes    Birth Control/ Protection: IUD   Other Topics Concern  . Not on file   Social History Narrative  . No narrative on file    Review of Systems: no vaginal bleeding or cramping/contractions, no LOF, no nausea/vomiting. All other systems reviewed and are negative.  PE:   Filed Vitals:   12/20/13 1201  BP: 137/74  Pulse: 76  Temp: 97.7 F (36.5 C)  Resp: 18    GEN: well-appearing female ABD: gravid, NT  Please see separate document for fetal ultrasound report.  Labs: CBC    Component Value Date/Time   WBC 14.4* 12/20/2013 0610   RBC 4.36 12/20/2013 0610   HGB 12.1 12/20/2013 0610   HCT 36.5 12/20/2013 0610   PLT 312 12/20/2013 0610   MCV 83.7 12/20/2013 0610   MCH 27.8 12/20/2013 0610   MCHC 33.2 12/20/2013 0610   RDW 14.1 12/20/2013 0610   CMP     Component Value Date/Time   NA 138 12/19/2013 1748   K 3.6* 12/19/2013 1748   CL 104 12/19/2013 1748   CO2 21 12/19/2013 1748   GLUCOSE  90 12/19/2013 1748   BUN 5* 12/19/2013 1748   CREATININE 0.40* 12/19/2013 1748   CALCIUM 9.0 12/19/2013 1748   PROT 6.6 12/19/2013 1748   ALBUMIN 2.7* 12/19/2013 1748   AST 11 12/19/2013 1748   ALT 8 12/19/2013 1748   ALKPHOS 99 12/19/2013 1748   BILITOT <0.2* 12/19/2013 1748   GFRNONAA >90 12/19/2013 1748   GFRAA >90 12/19/2013 1748     A/P: 1) Single IUP at [redacted]w[redacted]d         2) Chronic hypertension vs. Gestational Hypertension - BPs since admission fairly well controlled on current dose of Labetalol.  I suspect that this is most likely consistent with poorly controlled chronic hypertension or Gestational hypertension.  Superimposed preeclampsia is unlikely, but would await the results of the 24 hr urine  for further recommendations.  I stressed the importance of taking her blood pressure medications as prescribed.  Recommendations: 1) If BPs remain stable on current dose of Labetalol, would discharge home once betamethasone series complete and 24 hour urine results return.  May titrate dose of Labetalol upward if needed to max of 800 mg TID.  2) The patient will need close outpatient follow up.  Would recommend weekly BPs checks in clinic.  If BPs elevated, would repeat preeclampsia labs and would have low threshold to readmit to rule out superimpose preeclampsia.  3) Recommend serial growth scans every 4 weeks for the remainder of pregnancy  4) Antepartum fetal testing beginning at 32 weeks with 2x weekly NSTs and weekly AFIs.  5) If the patient otherwise remains stable, would recommend delivery at 37 weeks due to gestational hypertension.   Thank you for the opportunity to be a part of the care of Bridget Morales. Please contact our office if we can be of further assistance.   I spent approximately 30 minutes with this patient with over 50% of time spent in face-to-face counseling.  Benjaman Lobe, MD Maternal Fetal Medicine

## 2013-12-20 NOTE — Progress Notes (Signed)
33 y.o. G3P2 [redacted]w[redacted]d HD#1 admitted for 29 WKS, HIGH BP .  Pt currently stable with no c/o sx of preeclampsia.  Good FM.  Filed Vitals:   12/19/13 2300 12/20/13 0000 12/20/13 0100 12/20/13 0700  BP:  149/81    Pulse:  97    Temp:  98 F (36.7 C)    TempSrc:  Oral    Resp: 18 18 18 18   Height:      Weight:        Lungs CTA Cor RRR Abd  Soft, gravid, nontender Ex SCDs FHTs  120s, good short term variability, NST R Toco  q 10- 20  Results for orders placed during the hospital encounter of 12/19/13 (from the past 24 hour(s))  URINALYSIS, ROUTINE W REFLEX MICROSCOPIC     Status: Abnormal   Collection Time    12/19/13  5:10 PM      Result Value Ref Range   Color, Urine YELLOW  YELLOW   APPearance CLEAR  CLEAR   Specific Gravity, Urine <1.005 (*) 1.005 - 1.030   pH 6.5  5.0 - 8.0   Glucose, UA NEGATIVE  NEGATIVE mg/dL   Hgb urine dipstick MODERATE (*) NEGATIVE   Bilirubin Urine NEGATIVE  NEGATIVE   Ketones, ur NEGATIVE  NEGATIVE mg/dL   Protein, ur NEGATIVE  NEGATIVE mg/dL   Urobilinogen, UA 0.2  0.0 - 1.0 mg/dL   Nitrite NEGATIVE  NEGATIVE   Leukocytes, UA TRACE (*) NEGATIVE  URINE MICROSCOPIC-ADD ON     Status: Abnormal   Collection Time    12/19/13  5:10 PM      Result Value Ref Range   Squamous Epithelial / LPF FEW (*) RARE   WBC, UA 7-10  <3 WBC/hpf   RBC / HPF 7-10  <3 RBC/hpf   Bacteria, UA MANY (*) RARE   Urine-Other MUCOUS PRESENT    CBC     Status: Abnormal   Collection Time    12/19/13  5:48 PM      Result Value Ref Range   WBC 12.6 (*) 4.0 - 10.5 K/uL   RBC 4.32  3.87 - 5.11 MIL/uL   Hemoglobin 12.0  12.0 - 15.0 g/dL   HCT 35.7 (*) 36.0 - 46.0 %   MCV 82.6  78.0 - 100.0 fL   MCH 27.8  26.0 - 34.0 pg   MCHC 33.6  30.0 - 36.0 g/dL   RDW 13.9  11.5 - 15.5 %   Platelets 327  150 - 400 K/uL  COMPREHENSIVE METABOLIC PANEL     Status: Abnormal   Collection Time    12/19/13  5:48 PM      Result Value Ref Range   Sodium 138  137 - 147 mEq/L   Potassium 3.6  (*) 3.7 - 5.3 mEq/L   Chloride 104  96 - 112 mEq/L   CO2 21  19 - 32 mEq/L   Glucose, Bld 90  70 - 99 mg/dL   BUN 5 (*) 6 - 23 mg/dL   Creatinine, Ser 0.40 (*) 0.50 - 1.10 mg/dL   Calcium 9.0  8.4 - 10.5 mg/dL   Total Protein 6.6  6.0 - 8.3 g/dL   Albumin 2.7 (*) 3.5 - 5.2 g/dL   AST 11  0 - 37 U/L   ALT 8  0 - 35 U/L   Alkaline Phosphatase 99  39 - 117 U/L   Total Bilirubin <0.2 (*) 0.3 - 1.2 mg/dL   GFR calc non Af Amer >  90  >90 mL/min   GFR calc Af Amer >90  >90 mL/min  PROTEIN / CREATININE RATIO, URINE     Status: Abnormal   Collection Time    12/19/13  5:48 PM      Result Value Ref Range   Creatinine, Urine 61.34     Total Protein, Urine 10.6     PROTEIN CREATININE RATIO 0.17 (*) 0.00 - 0.15  CBC     Status: Abnormal   Collection Time    12/20/13  6:10 AM      Result Value Ref Range   WBC 14.4 (*) 4.0 - 10.5 K/uL   RBC 4.36  3.87 - 5.11 MIL/uL   Hemoglobin 12.1  12.0 - 15.0 g/dL   HCT 36.5  36.0 - 46.0 %   MCV 83.7  78.0 - 100.0 fL   MCH 27.8  26.0 - 34.0 pg   MCHC 33.2  30.0 - 36.0 g/dL   RDW 14.1  11.5 - 15.5 %   Platelets 312  150 - 400 K/uL    A:  HD#1  [redacted]w[redacted]d with CHTN with superimposed PIH.  Currently no other s/s preeclampsia.    P: 1.  S/p BMZ. 2.  24 hour urine for protein in process. 3.  BPs now controlled with Labetalol. 4.  MFM to see today- await further recommendations. 5.  Neo has seen pt- will get anethesia consult as well.   6.  Await MFM [place] for presentation and EFW.  Francetta Ilg A

## 2013-12-21 LAB — CBC
HCT: 36.7 % (ref 36.0–46.0)
Hemoglobin: 12.1 g/dL (ref 12.0–15.0)
MCH: 27.6 pg (ref 26.0–34.0)
MCHC: 33 g/dL (ref 30.0–36.0)
MCV: 83.6 fL (ref 78.0–100.0)
PLATELETS: 334 10*3/uL (ref 150–400)
RBC: 4.39 MIL/uL (ref 3.87–5.11)
RDW: 14.3 % (ref 11.5–15.5)
WBC: 14 10*3/uL — ABNORMAL HIGH (ref 4.0–10.5)

## 2013-12-21 LAB — PROTEIN, URINE, 24 HOUR
Collection Interval-UPROT: 24 hours
PROTEIN, URINE: 16 mg/dL
Protein, 24H Urine: 276 mg/d — ABNORMAL HIGH (ref 50–100)
Urine Total Volume-UPROT: 1725 mL

## 2013-12-21 MED ORDER — LABETALOL HCL 300 MG PO TABS: 300.0000 mg | ORAL_TABLET | Freq: Two times a day (BID) | ORAL | Status: DC

## 2013-12-21 NOTE — Progress Notes (Signed)
Name: Bridget Morales Medical Record Number:  [id number] Date of Birth: 1981/02/11 Date of Service: 12/21/2013  [facility] day #2 admitted to antepartum service for prenatal diagnosis of severe range CHTN with superimposed PIH. She is currently asymptomatic no HA, no scotomata or visual changes. She denies RUQ pain, no contractions, and notes good fetal movements. The patient is a 33 y.o. G3P2, with an EDD of 03/01/2014, by Other Basis dating method.      Physical Examination:  General appearance - alert, well appearing, and in no distress and oriented to person, place, and time Chest - clear to auscultation, no wheezes, rales or rhonchi, symmetric air entry Heart - normal rate, regular rhythm, normal S1, S2, no murmurs, rubs, clicks or gallops Abdomen - Gravid, soft, nontender, nondistended, no masses or organomegaly Neurological - alert, oriented, normal speech, no focal findings or movement disorder noted, DTR's normal and symmetric Musculoskeletal - no joint tenderness, deformity or swelling Extremities - peripheral pulses normal, no pedal edema, no clubbing or cyanosis  The patient's past medical history and prenatal records were reviewed.  Additional issues addressed and updated today: Patient Active Problem List   Diagnosis Date Noted  . Gestational hypertension 12/19/2013   Family History  Problem Relation Age of Onset  . Diabetes Father   . Diabetes Maternal Grandmother    History   Social History  . Marital Status: Married    Spouse Name: N/A    Number of Children: N/A  . Years of Education: N/A   Social History Main Topics  . Smoking status: Never Smoker   . Smokeless tobacco: Never Used  . Alcohol Use: No  . Drug Use: No  . Sexual Activity: Yes    Birth Control/ Protection: IUD   Other Topics Concern  . None   Social History Narrative  . None    24 hour urine 276 CBC today normal MFM ultrasound yesterday shows EFW AGA at 69% and  polyhydramnios  A/P: 33 yo G3P2 at 30 weeks with CHTN with superimposed PIH no e/o pre-eclampsia by clinical  Symptoms or labs. Systolic BP still over 657 with Labetalol 300 BID will consider increasing to TID.  Will consider Discharge home today or tomorrow with biweekly NSTs and BPPs and serial growth  Scans Q month for growth. Patient counseled for more than 20 minutes about importance of  Compliance with her medication.   Loni Abdon STACIA

## 2013-12-21 NOTE — Progress Notes (Signed)
Pt. Is stable and ready to be discharged home. All belongings are with the patient. All discharge instructions given and prescriptions reviewed. MD said it was okay for her to drive home. Pt. Wheeled out via wheelchair to the front of the hospital and her husband brought the car around.

## 2013-12-21 NOTE — Discharge Instructions (Signed)
Hypertension During Pregnancy Hypertension is also called high blood pressure. Blood pressure moves blood in your body. Sometimes, the force that moves the blood becomes too strong. When you are pregnant, this condition should be watched carefully. It can cause problems for you and your baby. HOME CARE   Make and keep all of your doctor visits.  Take medicine as told by your doctor. Tell your doctor about all medicines you take.  Eat very little salt.  Exercise regularly.  Do not drink alcohol.  Do not smoke.  Do not have drinks with caffeine.  Lie on your left side when resting. GET HELP RIGHT AWAY IF:  You have bad belly (abdominal) pain.  You have sudden puffiness (swelling) in the hands, ankles, or face.  You gain 4 pounds (1.8 kilograms) or more in 1 week.  You throw up (vomit) repeatedly.  You have bleeding from the vagina.  You do not feel the baby moving as much.  You have a headache.  You have blurred or double vision.  You have muscle twitching or spasms.  You have shortness of breath.  You have blue fingernails and lips.  You have blood in your pee (urine). MAKE SURE YOU:  Understand these instructions.  Will watch your condition.  Will get help right away if you are not doing well or get worse. Document Released: 10/24/2010 Document Revised: 07/12/2013 Document Reviewed: 04/20/2013 [facility] Patient Information 2014 [place], [place].

## 2013-12-21 NOTE — Discharge Summary (Signed)
Obstetric Discharge Summary Reason for Admission: observation/evaluation and management of hypertension Prenatal Procedures: NST Intrapartum Procedures: none  Hemoglobin  Date Value Ref Range Status  12/21/2013 12.1  12.0 - 15.0 g/dL Final     HCT  Date Value Ref Range Status  12/21/2013 36.7  36.0 - 46.0 % Final    Physical Exam:  General appearance - alert, well appearing, and in no distress and oriented to person, place, and time  Chest - clear to auscultation, no wheezes, rales or rhonchi, symmetric air entry  Heart - normal rate, regular rhythm, normal S1, S2, no murmurs, rubs, clicks or gallops  Abdomen - Gravid, soft, nontender, nondistended, no masses or organomegaly  Neurological - alert, oriented, normal speech, no focal findings or movement disorder noted, DTR's normal and symmetric  Musculoskeletal - no joint tenderness, deformity or swelling  Extremities - peripheral pulses normal, no pedal edema, no clubbing or cyanosis  Labs Studies 24 hour urine 276  PIH labs normal with normal creatinine CBC today normal   MFM ultrasound yesterday shows EFW AGA at 69% and polyhydramnios     Discharge Diagnoses: Pregnancy induced hypertension  Discharge Information: Date: 12/21/2013 Activity: rest at home Diet: routine and low sodium Medications: PNV and labetalol 300 mg BID Condition: stable Instructions: refer to practice specific booklet Discharge to: home Follow up: Patient to have weekly office visits with twice weekly antepartum fetal testing  Monthly growth ultrasounds.     Auburn Hester, [place] 12/21/2013, 11:58 AM

## 2014-01-03 ENCOUNTER — Inpatient Hospital Stay (HOSPITAL_COMMUNITY)
Admission: AD | Admit: 2014-01-03 | Discharge: 2014-01-03 | Disposition: A | Discharge status: Home / Self Care | Payer: BC Managed Care – PPO | Source: Ambulatory Visit | Attending: Obstetrics and Gynecology | Admitting: Obstetrics and Gynecology

## 2014-01-03 ENCOUNTER — Encounter (HOSPITAL_COMMUNITY): Payer: Self-pay | Admitting: *Deleted

## 2014-01-03 DIAGNOSIS — O368390 Maternal care for abnormalities of the fetal heart rate or rhythm, unspecified trimester, not applicable or unspecified: Secondary | ICD-10-CM | POA: Insufficient documentation

## 2014-01-03 DIAGNOSIS — O288 Other abnormal findings on antenatal screening of mother: Secondary | ICD-10-CM

## 2014-01-03 DIAGNOSIS — O289 Unspecified abnormal findings on antenatal screening of mother: Secondary | ICD-10-CM

## 2014-01-03 DIAGNOSIS — O10919 Unspecified pre-existing hypertension complicating pregnancy, unspecified trimester: Secondary | ICD-10-CM

## 2014-01-03 DIAGNOSIS — O139 Gestational [pregnancy-induced] hypertension without significant proteinuria, unspecified trimester: Secondary | ICD-10-CM | POA: Insufficient documentation

## 2014-01-03 HISTORY — DX: Gestational (pregnancy-induced) hypertension without significant proteinuria, unspecified trimester: O13.9

## 2014-01-03 NOTE — Discharge Instructions (Signed)
Nonstress Test The nonstress test is a procedure that monitors the fetus's heartbeat. The test will monitor the heartbeat when the fetus is at rest and while the fetus is moving. In a healthy fetus, there will be an increase in fetal heart rate when the fetus moves or kicks. The heart rate will decrease at rest. This test helps determine if the fetus is healthy. The test may take up to a half hour. Your caregiver will look at a number of patterns in the heart rate tracing to make sure your baby is thriving. If there is concern, your caregiver may order additional tests or may suggest another course of action. This test is often done in the third trimester and can help determine if an early delivery is needed and safe. Common reasons to have this test are:  You are past your due date.  You have a high-risk pregnancy.  You are feeling less movement than normal.  You have lost a pregnancy in the past.  Your caregiver suspects fetal growth problems.  You have too much or too little amniotic fluid. BEFORE THE PROCEDURE  Eat a meal right before the test or as directed by your caregiver. Food may help stimulate fetal movements.  Use the restroom right before the test. PROCEDURE  Two belts will be placed around your abdomen. These belts have monitors attached to them. One records the fetal heart rate and the other records uterine contractions.  You may be asked to lie down on your side or to stay sitting upright.  You may be given a button to press when you feel movement.  The fetal heartbeat is listened to and watched on a screen. The heartbeat is recorded on a sheet of paper.  If the fetus seems to be sleeping, you may be asked to drink some juice or soda, gently press your abdomen, or make some noise to wake the fetus. AFTER THE PROCEDURE  Your caregiver will discuss the test results with you and make recommendations for the near future. Document Released: 09/11/2002 Document Revised:  01/16/2013 Document Reviewed: 10/25/2012 [employer] Patient Information 2014 [employer].  

## 2014-01-03 NOTE — MAU Note (Signed)
Pt sent from MD office for non-reactive NST, BP's also elevated.  Pt denies HA, visual changes, epigastric pain.

## 2014-01-03 NOTE — MAU Provider Note (Signed)
  History     CSN: [id number]  Arrival date and time: 01/03/14 1401   None     Chief Complaint  Patient presents with  . nonreactive NST    HPI THis is a 33 y.o. female at [redacted]w[redacted]d who presents for NST for nonreactive NST in office. Has had elevated BPs. Denies PIH symptoms.   RN Note: Pt sent from MD office for non-reactive NST, BP's also elevated. Pt denies HA, visual changes, epigastric pain.       OB History   Grav Para Term Preterm Abortions TAB SAB Ect Mult Living   3 2        2       Past Medical History  Diagnosis Date  . Medical history non-contributory   . Previous pregnancy complicated by pregnancy-induced hypertension, antepartum   . Pregnancy induced hypertension     Past Surgical History  Procedure Laterality Date  . Wisdom tooth extraction      Family History  Problem Relation Age of Onset  . Diabetes Father   . Diabetes Maternal Grandmother     History  Substance Use Topics  . Smoking status: Never Smoker   . Smokeless tobacco: Never Used  . Alcohol Use: No    Allergies: No Known Allergies  Prescriptions prior to admission  Medication Sig Dispense Refill  . labetalol (NORMODYNE) 300 MG tablet Take 1 tablet (300 mg total) by mouth 2 (two) times daily.  120 tablet  3  . Prenatal Vit-Fe Fumarate-FA (PRENATAL MULTIVITAMIN) TABS tablet Take 1 tablet by mouth daily at 12 noon.        Review of Systems  Constitutional: Negative for fever, chills and malaise/fatigue.  Eyes: Negative for blurred vision and double vision.  Cardiovascular: Negative for leg swelling.  Gastrointestinal: Negative for nausea, vomiting and abdominal pain.  Neurological: Negative for headaches.   Physical Exam   Blood pressure 159/82, pulse 99, temperature 98.1 F (36.7 C), temperature source Oral, resp. rate 20, last menstrual period 12/07/2012.  Filed Vitals:   01/03/14 1420 01/03/14 1432 01/03/14 1447 01/03/14 1502  BP: 148/87 159/82 154/84 161/94  Pulse: 96 99  88 102  Temp: 98.1 F (36.7 C)     TempSrc: Oral     Resp: 20       Physical Exam  Constitutional: She is oriented to person, place, and time. She appears well-developed and well-nourished. No distress.  HENT:  Head: Normocephalic.  Cardiovascular: Normal rate.   Respiratory: Effort normal.  GI: Soft. There is no tenderness.  Musculoskeletal: Normal range of motion. She exhibits no edema.  Neurological: She is alert and oriented to person, place, and time.  Skin: Skin is warm and dry.  Psychiatric: She has a normal mood and affect.   NST reactive  No contractions  MAU Course  Procedures  Assessment and Plan  A:  SIUP at  [redacted]w[redacted]d       Hypertension      Reassuring FHR tracing, Category I  P:  DIscussed with Dr Rogue Bussing       Discharge home       Follow up in office  [facility] 01/03/2014, 3:33 PM

## 2014-01-31 ENCOUNTER — Encounter (HOSPITAL_COMMUNITY): Payer: Self-pay | Admitting: *Deleted

## 2014-01-31 ENCOUNTER — Inpatient Hospital Stay (HOSPITAL_COMMUNITY)
Admission: AD | Admit: 2014-01-31 | Discharge: 2014-01-31 | Disposition: A | Discharge status: Home / Self Care | Payer: BC Managed Care – PPO | Source: Ambulatory Visit | Attending: Obstetrics and Gynecology | Admitting: Obstetrics and Gynecology

## 2014-01-31 DIAGNOSIS — O10919 Unspecified pre-existing hypertension complicating pregnancy, unspecified trimester: Secondary | ICD-10-CM

## 2014-01-31 DIAGNOSIS — O10019 Pre-existing essential hypertension complicating pregnancy, unspecified trimester: Secondary | ICD-10-CM | POA: Insufficient documentation

## 2014-01-31 LAB — COMPREHENSIVE METABOLIC PANEL
ALBUMIN: 2.7 g/dL — AB (ref 3.5–5.2)
ALK PHOS: 111 U/L (ref 39–117)
ALT: 12 U/L (ref 0–35)
AST: 19 U/L (ref 0–37)
BUN: 6 mg/dL (ref 6–23)
CHLORIDE: 102 meq/L (ref 96–112)
CO2: 23 mEq/L (ref 19–32)
Calcium: 8.9 mg/dL (ref 8.4–10.5)
Creatinine, Ser: 0.42 mg/dL — ABNORMAL LOW (ref 0.50–1.10)
GFR calc Af Amer: >90 mL/min (ref >90)
GFR calc non Af Amer: >90 mL/min (ref >90)
Glucose, Bld: 133 mg/dL — ABNORMAL HIGH (ref 70–99)
POTASSIUM: 4 meq/L (ref 3.7–5.3)
Sodium: 139 mEq/L (ref 137–147)
TOTAL PROTEIN: 6.4 g/dL (ref 6.0–8.3)

## 2014-01-31 LAB — CBC
HCT: 37 % (ref 36.0–46.0)
Hemoglobin: 12.2 g/dL (ref 12.0–15.0)
MCH: 27.7 pg (ref 26.0–34.0)
MCHC: 33 g/dL (ref 30.0–36.0)
MCV: 83.9 fL (ref 78.0–100.0)
PLATELETS: 281 10*3/uL (ref 150–400)
RBC: 4.41 MIL/uL (ref 3.87–5.11)
RDW: 15 % (ref 11.5–15.5)
WBC: 11.3 10*3/uL — AB (ref 4.0–10.5)

## 2014-01-31 LAB — URIC ACID: Uric Acid, Serum: 4.4 mg/dL (ref 2.4–7.0)

## 2014-01-31 LAB — LACTATE DEHYDROGENASE: LDH: 222 U/L (ref 94–250)

## 2014-01-31 NOTE — Discharge Instructions (Signed)
Hypertension During Pregnancy °Hypertension is also called high blood pressure. Blood pressure moves blood in your body. Sometimes, the force that moves the blood becomes too strong. When you are pregnant, this condition should be watched carefully. It can cause problems for you and your baby. °HOME CARE  °· Make and keep all of your doctor visits. °· Take medicine as told by your doctor. Tell your doctor about all medicines you take. °· Eat very little salt. °· Exercise regularly. °· Do not drink alcohol. °· Do not smoke. °· Do not have drinks with caffeine. °· Lie on your left side when resting. °GET HELP RIGHT AWAY IF: °· You have bad belly (abdominal) pain. °· You have sudden puffiness (swelling) in the hands, ankles, or face. °· You gain 4 pounds (1.8 kilograms) or more in 1 week. °· You throw up (vomit) repeatedly. °· You have bleeding from the vagina. °· You do not feel the baby moving as much. °· You have a headache. °· You have blurred or double vision. °· You have muscle twitching or spasms. °· You have shortness of breath. °· You have blue fingernails and lips. °· You have blood in your pee (urine). °MAKE SURE YOU: °· Understand these instructions. °· Will watch your condition. °· Will get help right away if you are not doing well or get worse. °Document Released: 10/24/2010 Document Revised: 07/12/2013 Document Reviewed: 04/20/2013 °[employer]® Patient Information ©2014 [employer]. ° °

## 2014-01-31 NOTE — MAU Note (Signed)
Sent from office for NST and labwork.  BP has been elevated about on month.  Is now on medication. Denies HA, visual changes, increased swelling or epigastric pain.

## 2014-01-31 NOTE — MAU Provider Note (Signed)
History     CSN: [id number]  Arrival date and time: 01/31/14 1200   First Provider Initiated Contact with Patient 01/31/14 1232      Chief Complaint  Patient presents with  . Hypertension   HPI Ms. Bridget Morales is a 33 y.o. G3P2 at [redacted]w[redacted]d who presents to MAU today from the office for extended monitoring and further evaluation of elevated BP. The patient denies a history of HTN or pre-eclampsia. She denies headache, vision changes, blurred vision, RUQ pain or significant peripheral edema. She denies vaginal bleeding, discharge or LOF. She denies contractions. She reports good fetal movement.   OB History   Grav Para Term Preterm Abortions TAB SAB Ect Mult Living   3 2        2       Past Medical History  Diagnosis Date  . Medical history non-contributory   . Previous pregnancy complicated by pregnancy-induced hypertension, antepartum   . Pregnancy induced hypertension     Past Surgical History  Procedure Laterality Date  . Wisdom tooth extraction      Family History  Problem Relation Age of Onset  . Diabetes Father   . Diabetes Maternal Grandmother     History  Substance Use Topics  . Smoking status: Never Smoker   . Smokeless tobacco: Never Used  . Alcohol Use: No    Allergies: No Known Allergies  Prescriptions prior to admission  Medication Sig Dispense Refill  . labetalol (NORMODYNE) 300 MG tablet Take 1 tablet (300 mg total) by mouth 2 (two) times daily.  120 tablet  3  . Prenatal Vit-Fe Fumarate-FA (PRENATAL MULTIVITAMIN) TABS tablet Take 1 tablet by mouth daily at 12 noon.        Review of Systems  Constitutional: Negative for fever.  Eyes: Negative for blurred vision and double vision.  Gastrointestinal: Negative for abdominal pain.  Genitourinary:       Neg -vaginal bleeding, discharge, LOF  Neurological: Negative for headaches.   Physical Exam   Blood pressure 165/89, pulse 87, temperature 98.8 F (37.1 C), temperature source Oral, resp.  rate 18, height 5\' 5"  (1.651 m), weight 225 lb (102.059 kg), last menstrual period 12/07/2012.  Physical Exam  Constitutional: She is oriented to person, place, and time. She appears well-developed and well-nourished. No distress.  HENT:  Head: Normocephalic and atraumatic.  Cardiovascular: Normal rate.   Respiratory: Effort normal.  GI: Soft. She exhibits no distension and no mass. There is no tenderness. There is no rebound and no guarding.  Musculoskeletal: She exhibits no edema.  Neurological: She is alert and oriented to person, place, and time. She has normal reflexes.  No clonus  Skin: Skin is warm and dry. No erythema.  Psychiatric: She has a normal mood and affect.   Results for orders placed during the hospital encounter of 01/31/14 (from the past 24 hour(s))  CBC     Status: Abnormal   Collection Time    01/31/14 12:37 PM      Result Value Ref Range   WBC 11.3 (*) 4.0 - 10.5 K/uL   RBC 4.41  3.87 - 5.11 MIL/uL   Hemoglobin 12.2  12.0 - 15.0 g/dL   HCT 37.0  36.0 - 46.0 %   MCV 83.9  78.0 - 100.0 fL   MCH 27.7  26.0 - 34.0 pg   MCHC 33.0  30.0 - 36.0 g/dL   RDW 15.0  11.5 - 15.5 %   Platelets 281  150 - 400 K/uL  COMPREHENSIVE METABOLIC PANEL     Status: Abnormal   Collection Time    01/31/14 12:37 PM      Result Value Ref Range   Sodium 139  137 - 147 mEq/L   Potassium 4.0  3.7 - 5.3 mEq/L   Chloride 102  96 - 112 mEq/L   CO2 23  19 - 32 mEq/L   Glucose, Bld 133 (*) 70 - 99 mg/dL   BUN 6  6 - 23 mg/dL   Creatinine, Ser 0.42 (*) 0.50 - 1.10 mg/dL   Calcium 8.9  8.4 - 10.5 mg/dL   Total Protein 6.4  6.0 - 8.3 g/dL   Albumin 2.7 (*) 3.5 - 5.2 g/dL   AST 19  0 - 37 U/L   ALT 12  0 - 35 U/L   Alkaline Phosphatase 111  39 - 117 U/L   Total Bilirubin <0.2 (*) 0.3 - 1.2 mg/dL   GFR calc non Af Amer >90  >90 mL/min   GFR calc Af Amer >90  >90 mL/min  URIC ACID     Status: None   Collection Time    01/31/14 12:37 PM      Result Value Ref Range   Uric Acid, Serum  4.4  2.4 - 7.0 mg/dL  LACTATE DEHYDROGENASE     Status: None   Collection Time    01/31/14 12:37 PM      Result Value Ref Range   LDH 222  94 - 250 U/L    Fetal monitoring: Baseline: 140 bpm, moderate variability, + accelerations, one variable deceleration Contractions: moderate UI, occasional contractions  MAU Course  Procedures None  MDM UA, CBC, CMP, uric acid, LDH today Discussed patient and results with Dr. Ouida Sills. Schedule MFM consult for Thursday or Friday of this week. Follow-up in the office after that appointment   Assessment and Plan  A: Chronic HTN in pregnancy  P: Discharge home Pre-eclampsia warning signs discussed Patient given appointment for Friday at 11:00 am in MFM Patient instructed to call the office for follow-up appointment after seen in MFM Patient may return to MAU as needed or if her condition were to change or worsen  Farris Has, PA-C  01/31/2014, 1:31 PM

## 2014-01-31 NOTE — MAU Note (Addendum)
PT SAYS SHE JUST CAME FROM OFFICE -   WITH BP INCREASING EACH VISIT.  -  URINE HAD PROTEIN.     PLAN WAS TO COME HERE THEN MAYBE  NEXT TO MFM.    IS ON  LEBETALOL     FOR BP.-  300MG    BID

## 2014-02-02 ENCOUNTER — Ambulatory Visit (HOSPITAL_COMMUNITY)
Admit: 2014-02-02 | Discharge: 2014-02-02 | Disposition: A | Discharge status: Home / Self Care | Payer: BC Managed Care – PPO | Attending: Obstetrics and Gynecology | Admitting: Obstetrics and Gynecology

## 2014-02-02 ENCOUNTER — Inpatient Hospital Stay (HOSPITAL_COMMUNITY)
Admission: AD | Admit: 2014-02-02 | Discharge: 2014-02-06 | DRG: 774 | Disposition: A | Discharge status: Home / Self Care | Payer: BC Managed Care – PPO | Source: Ambulatory Visit | Attending: Obstetrics and Gynecology | Admitting: Obstetrics and Gynecology

## 2014-02-02 ENCOUNTER — Encounter (HOSPITAL_COMMUNITY): Payer: Self-pay | Admitting: *Deleted

## 2014-02-02 DIAGNOSIS — O139 Gestational [pregnancy-induced] hypertension without significant proteinuria, unspecified trimester: Principal | ICD-10-CM | POA: Diagnosis present

## 2014-02-02 DIAGNOSIS — O1415 Severe pre-eclampsia, complicating the puerperium: Secondary | ICD-10-CM | POA: Diagnosis not present

## 2014-02-02 DIAGNOSIS — IMO0002 Reserved for concepts with insufficient information to code with codable children: Secondary | ICD-10-CM

## 2014-02-02 DIAGNOSIS — Z3480 Encounter for supervision of other normal pregnancy, unspecified trimester: Secondary | ICD-10-CM

## 2014-02-02 DIAGNOSIS — Z3490 Encounter for supervision of normal pregnancy, unspecified, unspecified trimester: Secondary | ICD-10-CM

## 2014-02-02 HISTORY — DX: Benign neoplasm of connective and other soft tissue, unspecified: D21.9

## 2014-02-02 LAB — TYPE AND SCREEN
ABO/RH(D): O POS
ANTIBODY SCREEN: NEGATIVE

## 2014-02-02 LAB — COMPREHENSIVE METABOLIC PANEL
ALK PHOS: 121 U/L — AB (ref 39–117)
ALT: 12 U/L (ref 0–35)
AST: 14 U/L (ref 0–37)
Albumin: 2.8 g/dL — ABNORMAL LOW (ref 3.5–5.2)
BUN: 5 mg/dL — ABNORMAL LOW (ref 6–23)
CHLORIDE: 101 meq/L (ref 96–112)
CO2: 21 meq/L (ref 19–32)
Calcium: 8.9 mg/dL (ref 8.4–10.5)
Creatinine, Ser: 0.42 mg/dL — ABNORMAL LOW (ref 0.50–1.10)
GLUCOSE: 113 mg/dL — AB (ref 70–99)
POTASSIUM: 4.1 meq/L (ref 3.7–5.3)
SODIUM: 137 meq/L (ref 137–147)
Total Bilirubin: <0.2 mg/dL — ABNORMAL LOW (ref 0.3–1.2)
Total Protein: 6.6 g/dL (ref 6.0–8.3)

## 2014-02-02 LAB — CBC
HCT: 38.2 % (ref 36.0–46.0)
HEMOGLOBIN: 12.9 g/dL (ref 12.0–15.0)
MCH: 28.1 pg (ref 26.0–34.0)
MCHC: 33.8 g/dL (ref 30.0–36.0)
MCV: 83.2 fL (ref 78.0–100.0)
PLATELETS: 281 10*3/uL (ref 150–400)
RBC: 4.59 MIL/uL (ref 3.87–5.11)
RDW: 15 % (ref 11.5–15.5)
WBC: 12.7 10*3/uL — AB (ref 4.0–10.5)

## 2014-02-02 LAB — URINALYSIS, ROUTINE W REFLEX MICROSCOPIC
BILIRUBIN URINE: NEGATIVE
Glucose, UA: NEGATIVE mg/dL
Ketones, ur: NEGATIVE mg/dL
NITRITE: NEGATIVE
PH: 6 (ref 5.0–8.0)
Protein, ur: NEGATIVE mg/dL
Specific Gravity, Urine: <1.005 — ABNORMAL LOW (ref 1.005–1.030)
Urobilinogen, UA: 0.2 mg/dL (ref 0.0–1.0)

## 2014-02-02 LAB — PROTEIN / CREATININE RATIO, URINE
CREATININE, URINE: 27.99 mg/dL
PROTEIN CREATININE RATIO: 0.48 — AB (ref 0.00–0.15)
TOTAL PROTEIN, URINE: 13.5 mg/dL

## 2014-02-02 LAB — URINE MICROSCOPIC-ADD ON

## 2014-02-02 LAB — ABO/RH: ABO/RH(D): O POS

## 2014-02-02 MED ORDER — CITRIC ACID-SODIUM CITRATE 334-500 MG/5ML PO SOLN: 30.0000 mL | ORAL | Status: DC | PRN

## 2014-02-02 MED ORDER — LACTATED RINGERS IV SOLN: 500.0000 mL | Freq: Once | INTRAVENOUS | Status: DC

## 2014-02-02 MED ORDER — OXYTOCIN 40 UNITS IN LACTATED RINGERS INFUSION - SIMPLE MED: 62.5000 mL/h | INTRAVENOUS | Status: DC

## 2014-02-02 MED ORDER — FENTANYL 2.5 MCG/ML BUPIVACAINE 1/10 % EPIDURAL INFUSION (WH - ANES)
14.0000 mL/h | INTRAMUSCULAR | Status: DC | PRN
  Filled 2014-02-02: qty 125

## 2014-02-02 MED ORDER — OXYCODONE-ACETAMINOPHEN 5-325 MG PO TABS: 1.0000 | ORAL_TABLET | ORAL | Status: DC | PRN

## 2014-02-02 MED ORDER — EPHEDRINE 5 MG/ML INJ
10.0000 mg | INTRAVENOUS | Status: DC | PRN
  Filled 2014-02-02: qty 4

## 2014-02-02 MED ORDER — LACTATED RINGERS IV SOLN
INTRAVENOUS | Status: DC
  Administered 2014-02-02 – 2014-02-03 (×3): via INTRAVENOUS

## 2014-02-02 MED ORDER — PHENYLEPHRINE 40 MCG/ML (10ML) SYRINGE FOR IV PUSH (FOR BLOOD PRESSURE SUPPORT): 80.0000 ug | PREFILLED_SYRINGE | INTRAVENOUS | Status: DC | PRN

## 2014-02-02 MED ORDER — LIDOCAINE HCL (PF) 1 % IJ SOLN: 30.0000 mL | INTRAMUSCULAR | Status: DC | PRN

## 2014-02-02 MED ORDER — PHENYLEPHRINE 40 MCG/ML (10ML) SYRINGE FOR IV PUSH (FOR BLOOD PRESSURE SUPPORT)
80.0000 ug | PREFILLED_SYRINGE | INTRAVENOUS | Status: DC | PRN
  Filled 2014-02-02: qty 10

## 2014-02-02 MED ORDER — FLEET ENEMA 7-19 GM/118ML RE ENEM: 1.0000 | ENEMA | RECTAL | Status: DC | PRN

## 2014-02-02 MED ORDER — EPHEDRINE 5 MG/ML INJ: 10.0000 mg | INTRAVENOUS | Status: DC | PRN

## 2014-02-02 MED ORDER — OXYTOCIN 40 UNITS IN LACTATED RINGERS INFUSION - SIMPLE MED
1.0000 m[IU]/min | INTRAVENOUS | Status: DC
  Administered 2014-02-03: 6 m[IU]/min via INTRAVENOUS
  Administered 2014-02-03: 4 m[IU]/min via INTRAVENOUS
  Administered 2014-02-03: 2 m[IU]/min via INTRAVENOUS
  Filled 2014-02-02: qty 1000

## 2014-02-02 MED ORDER — ZOLPIDEM TARTRATE 5 MG PO TABS: 5.0000 mg | ORAL_TABLET | Freq: Every evening | ORAL | Status: DC | PRN

## 2014-02-02 MED ORDER — LABETALOL HCL 300 MG PO TABS
300.0000 mg | ORAL_TABLET | Freq: Three times a day (TID) | ORAL | Status: DC
  Administered 2014-02-02 – 2014-02-05 (×9): 300 mg via ORAL
  Filled 2014-02-02 (×10): qty 1

## 2014-02-02 MED ORDER — MAGNESIUM SULFATE BOLUS VIA INFUSION
4.0000 g | Freq: Once | INTRAVENOUS | Status: DC
  Filled 2014-02-02: qty 500

## 2014-02-02 MED ORDER — TERBUTALINE SULFATE 1 MG/ML IJ SOLN: 0.2500 mg | Freq: Once | INTRAMUSCULAR | Status: AC | PRN

## 2014-02-02 MED ORDER — LABETALOL HCL 5 MG/ML IV SOLN
20.0000 mg | Freq: Once | INTRAVENOUS | Status: AC
  Administered 2014-02-02: 20 mg via INTRAVENOUS
  Filled 2014-02-02: qty 4

## 2014-02-02 MED ORDER — MAGNESIUM SULFATE 40 G IN LACTATED RINGERS - SIMPLE
2.0000 g/h | INTRAVENOUS | Status: DC
  Administered 2014-02-02: 2 g/h via INTRAVENOUS
  Administered 2014-02-02: 4 g/h via INTRAVENOUS
  Administered 2014-02-03: 2 g/h via INTRAVENOUS
  Filled 2014-02-02 (×2): qty 500

## 2014-02-02 MED ORDER — OXYTOCIN BOLUS FROM INFUSION: 500.0000 mL | INTRAVENOUS | Status: DC

## 2014-02-02 MED ORDER — ACETAMINOPHEN 325 MG PO TABS: 650.0000 mg | ORAL_TABLET | ORAL | Status: DC | PRN

## 2014-02-02 MED ORDER — MISOPROSTOL 25 MCG QUARTER TABLET
25.0000 ug | ORAL_TABLET | ORAL | Status: DC
  Administered 2014-02-02 (×2): 25 ug via VAGINAL
  Filled 2014-02-02 (×2): qty 0.25

## 2014-02-02 MED ORDER — DIPHENHYDRAMINE HCL 50 MG/ML IJ SOLN: 12.5000 mg | INTRAMUSCULAR | Status: DC | PRN

## 2014-02-02 MED ORDER — LACTATED RINGERS IV SOLN: 500.0000 mL | INTRAVENOUS | Status: DC | PRN

## 2014-02-02 MED ORDER — PENICILLIN G POTASSIUM 5000000 UNITS IJ SOLR
2.5000 10*6.[IU] | INTRAVENOUS | Status: DC
  Filled 2014-02-02 (×3): qty 2.5

## 2014-02-02 MED ORDER — IBUPROFEN 600 MG PO TABS: 600.0000 mg | ORAL_TABLET | Freq: Four times a day (QID) | ORAL | Status: DC | PRN

## 2014-02-02 MED ORDER — PENICILLIN G POTASSIUM 5000000 UNITS IJ SOLR
5.0000 10*6.[IU] | Freq: Once | INTRAVENOUS | Status: DC
  Filled 2014-02-02: qty 5

## 2014-02-02 MED ORDER — ONDANSETRON HCL 4 MG/2ML IJ SOLN: 4.0000 mg | Freq: Four times a day (QID) | INTRAMUSCULAR | Status: DC | PRN

## 2014-02-02 NOTE — MAU Note (Signed)
Pt was for direct admit per admit.

## 2014-02-02 NOTE — Consult Note (Signed)
MFM consult, Staff Note  Impressions:  1. CHTN 2. Superimposed gestational hypertension with severe features (persistent severe HTN)  Filed Vitals:   02/02/14 1140  BP: 166/98  Pulse: 77   BP 163/86 on 01/31/14  (meets criteria for 2 severe BP's greater than 4 hours apart). ? Discussion:  I reviewed hypertension and gestational hypertension as a cause of uteroplacental insufficiency, with increased risk of IUGR, oligohydramnios, and stillbirth. I told her that her hypertension with superimposed GHTN also places her at increased risk for renal failure, CVA, MI, and maternal death. Lastly, hypertension (severe range) increases the risk of placental abruption, especially in the setting of superimposed preeclampsia/GHTN (ie, managed the same due to similar risk profile).  I reviewed the essential tenets in the most recent guidelines for management of hypertension in pregnancy, namely, severe gestational hypertension in accordance with the [place] [facility] [employer] expert opinion: Given that she has persistently severe blood pressures at past [redacted] weeks gestation (now at 36wks1day), I recommend admiting this patient and recommend management as a severe superimposed gestational HTN, warranting delivery with intrapartum magnesium sulfate until 24 hours postpartum.  Summary of Recommendations:  1. I recommend magnesium sulfate now and to be continued until 24 hours postpartum for the prevention of eclamptic seizures in accordance with the most current guidelines for management of gestational hypertension with severe features as regardless of presence/absence of proteinuria, severe features warrant administration of magnesium sulfate for the safety of both mom and baby.  This recommendation was communicated directly with Dr. Freda Munro.  The infusion should continue until 24 hours postpartum.  2. I recommend delivery with admission to L&D to facilitate this now.  3. Assessment of  preeclamptic labs are indicated.  4. Recommend consultation with anesthesia.  5. CEFM until delivery.  I spent in excess of 40 minutes reviewing and evaluating this patient's case with greater than 50% of this time in face to face discussion. I spoke directly with Dr. Ouida Sills to convey my recommendations for magnesium sulfate and delivery.  My staff walked the patient to MAU by wheelchair to facilitate admission and needed medical care as described above.  Page with questions.  Eulis Foster, MD, MS, FACOG Assistant Professor, Maternal-Fetal Medicine

## 2014-02-02 NOTE — Progress Notes (Signed)
Per verbal with Dr. Ouida Sills, Pitocin is to start after second administration of Cytotec tonight, and  Pt may have an Ambien tonight, if needed.  Marquita Palms, RN 02/02/14 2155

## 2014-02-03 ENCOUNTER — Encounter (HOSPITAL_COMMUNITY): Payer: Self-pay | Admitting: Anesthesiology

## 2014-02-03 ENCOUNTER — Encounter (HOSPITAL_COMMUNITY): Payer: Self-pay | Admitting: *Deleted

## 2014-02-03 DIAGNOSIS — Z3480 Encounter for supervision of other normal pregnancy, unspecified trimester: Secondary | ICD-10-CM

## 2014-02-03 LAB — CBC
HCT: 39.6 % (ref 36.0–46.0)
Hemoglobin: 13.3 g/dL (ref 12.0–15.0)
MCH: 28.1 pg (ref 26.0–34.0)
MCHC: 33.6 g/dL (ref 30.0–36.0)
MCV: 83.5 fL (ref 78.0–100.0)
PLATELETS: 278 10*3/uL (ref 150–400)
RBC: 4.74 MIL/uL (ref 3.87–5.11)
RDW: 15.2 % (ref 11.5–15.5)
WBC: 15.5 10*3/uL — ABNORMAL HIGH (ref 4.0–10.5)

## 2014-02-03 LAB — MRSA PCR SCREENING: MRSA by PCR: NEGATIVE

## 2014-02-03 MED ORDER — DIBUCAINE 1 % RE OINT
1.0000 "application " | TOPICAL_OINTMENT | RECTAL | Status: DC | PRN
  Filled 2014-02-03: qty 28

## 2014-02-03 MED ORDER — EPHEDRINE 5 MG/ML INJ: 10.0000 mg | INTRAVENOUS | Status: DC | PRN

## 2014-02-03 MED ORDER — CARBOPROST TROMETHAMINE 250 MCG/ML IM SOLN
250.0000 ug | INTRAMUSCULAR | Status: DC | PRN
  Administered 2014-02-03: 250 ug via INTRAMUSCULAR

## 2014-02-03 MED ORDER — LACTATED RINGERS IV SOLN: 500.0000 mL | Freq: Once | INTRAVENOUS | Status: DC

## 2014-02-03 MED ORDER — DIPHENHYDRAMINE HCL 50 MG/ML IJ SOLN: 12.5000 mg | INTRAMUSCULAR | Status: DC | PRN

## 2014-02-03 MED ORDER — SENNOSIDES-DOCUSATE SODIUM 8.6-50 MG PO TABS
2.0000 | ORAL_TABLET | ORAL | Status: DC
  Filled 2014-02-03 (×2): qty 2

## 2014-02-03 MED ORDER — LACTATED RINGERS IV SOLN
INTRAVENOUS | Status: DC
  Administered 2014-02-03 – 2014-02-04 (×2): via INTRAVENOUS

## 2014-02-03 MED ORDER — BENZOCAINE-MENTHOL 20-0.5 % EX AERO
1.0000 "application " | INHALATION_SPRAY | CUTANEOUS | Status: DC | PRN
  Filled 2014-02-03 (×2): qty 56

## 2014-02-03 MED ORDER — PENICILLIN G POTASSIUM 5000000 UNITS IJ SOLR
2.5000 10*6.[IU] | INTRAMUSCULAR | Status: DC
  Filled 2014-02-03 (×2): qty 2.5

## 2014-02-03 MED ORDER — ONDANSETRON HCL 4 MG/2ML IJ SOLN: 4.0000 mg | INTRAMUSCULAR | Status: DC | PRN

## 2014-02-03 MED ORDER — SIMETHICONE 80 MG PO CHEW: 80.0000 mg | CHEWABLE_TABLET | ORAL | Status: DC | PRN

## 2014-02-03 MED ORDER — WITCH HAZEL-GLYCERIN EX PADS
1.0000 | MEDICATED_PAD | CUTANEOUS | Status: DC | PRN
Start: 2014-02-03 — End: 2014-02-06

## 2014-02-03 MED ORDER — OXYCODONE-ACETAMINOPHEN 5-325 MG PO TABS: 1.0000 | ORAL_TABLET | ORAL | Status: DC | PRN

## 2014-02-03 MED ORDER — MEASLES, MUMPS & RUBELLA VAC ~~LOC~~ INJ
0.5000 mL | INJECTION | Freq: Once | SUBCUTANEOUS | Status: DC
  Filled 2014-02-03: qty 0.5

## 2014-02-03 MED ORDER — ZOLPIDEM TARTRATE 5 MG PO TABS: 5.0000 mg | ORAL_TABLET | Freq: Every evening | ORAL | Status: DC | PRN

## 2014-02-03 MED ORDER — TETANUS-DIPHTH-ACELL PERTUSSIS 5-2.5-18.5 LF-MCG/0.5 IM SUSP
0.5000 mL | Freq: Once | INTRAMUSCULAR | Status: DC
  Filled 2014-02-03: qty 0.5

## 2014-02-03 MED ORDER — FENTANYL 2.5 MCG/ML BUPIVACAINE 1/10 % EPIDURAL INFUSION (WH - ANES): 13.0000 mL/h | INTRAMUSCULAR | Status: DC | PRN

## 2014-02-03 MED ORDER — PENICILLIN G POTASSIUM 5000000 UNITS IJ SOLR
5.0000 10*6.[IU] | Freq: Once | INTRAVENOUS | Status: AC
  Administered 2014-02-03: 5 10*6.[IU] via INTRAVENOUS
  Filled 2014-02-03: qty 5

## 2014-02-03 MED ORDER — PHENYLEPHRINE 40 MCG/ML (10ML) SYRINGE FOR IV PUSH (FOR BLOOD PRESSURE SUPPORT): 80.0000 ug | PREFILLED_SYRINGE | INTRAVENOUS | Status: DC | PRN

## 2014-02-03 MED ORDER — IBUPROFEN 600 MG PO TABS
600.0000 mg | ORAL_TABLET | Freq: Four times a day (QID) | ORAL | Status: DC
  Administered 2014-02-05: 600 mg via ORAL
  Filled 2014-02-03 (×7): qty 1

## 2014-02-03 MED ORDER — LABETALOL HCL 5 MG/ML IV SOLN
20.0000 mg | Freq: Once | INTRAVENOUS | Status: AC
  Administered 2014-02-03: 20 mg via INTRAVENOUS
  Filled 2014-02-03: qty 4

## 2014-02-03 MED ORDER — ONDANSETRON HCL 4 MG PO TABS: 4.0000 mg | ORAL_TABLET | ORAL | Status: DC | PRN

## 2014-02-03 MED ORDER — DIPHENOXYLATE-ATROPINE 2.5-0.025 MG PO TABS
2.0000 | ORAL_TABLET | Freq: Four times a day (QID) | ORAL | Status: DC | PRN
  Administered 2014-02-03: 2 via ORAL
  Filled 2014-02-03: qty 2

## 2014-02-03 MED ORDER — CARBOPROST TROMETHAMINE 250 MCG/ML IM SOLN
INTRAMUSCULAR | Status: AC
  Administered 2014-02-03: 250 ug via INTRAMUSCULAR
  Filled 2014-02-03: qty 1

## 2014-02-03 NOTE — Progress Notes (Signed)
02/03/14 0911  Vital Signs  BP ! 162/86 mmHg  Pulse Rate 78  Resp 16  Oxygen Therapy  SpO2 99 %  O2 Device None (Room air)  Dr. Ouida Sills notified of recent BP's-no new orders

## 2014-02-03 NOTE — Progress Notes (Addendum)
Called Dr. Ouida Sills re: stage 1 PPH. Orders received for IM Hemabate. Discussed POC with pt.

## 2014-02-03 NOTE — Progress Notes (Signed)
02/03/14 1152  Vitals  BP ! 179/90 mmHg  Pulse Rate 90  Oxygen Therapy  SpO2 98 %  O2 Device None (Room air)  20mg  IV Labetalol given

## 2014-02-03 NOTE — Progress Notes (Signed)
Called to patient's room secondary to faculty practice not available and patient involuntarily pushing.  Baby delivered ROA and body followed atraumatically.  Baby to abdomen with vigorous cry.  Dr. Ouida Sills arrived and took over care.    Linda Hedges, DO

## 2014-02-03 NOTE — H&P (Signed)
Pt is a 33 year old Black Female, G5P1102 at 24 weeks who is admitted for induction at the suggestion of MFM for gest HTN. She has no HA, vision changes, proteinuria. Good FM PMHX: see hollister. B/P-158/92 PE: HEENT-wnl        ABD-gravid, non tender, no RUQ pain.        FHTs- reactive NST IMP/ IUP at 36 weeks wit GHTN PLAN/ Admit

## 2014-02-04 ENCOUNTER — Encounter (HOSPITAL_COMMUNITY): Payer: Self-pay | Admitting: *Deleted

## 2014-02-04 LAB — COMPREHENSIVE METABOLIC PANEL
ALBUMIN: 2.3 g/dL — AB (ref 3.5–5.2)
ALT: 9 U/L (ref 0–35)
AST: 14 U/L (ref 0–37)
Alkaline Phosphatase: 94 U/L (ref 39–117)
BUN: 5 mg/dL — AB (ref 6–23)
CO2: 25 mEq/L (ref 19–32)
CREATININE: 0.56 mg/dL (ref 0.50–1.10)
Calcium: 7.8 mg/dL — ABNORMAL LOW (ref 8.4–10.5)
Chloride: 102 mEq/L (ref 96–112)
GFR calc Af Amer: >90 mL/min (ref >90)
GFR calc non Af Amer: >90 mL/min (ref >90)
Glucose, Bld: 119 mg/dL — ABNORMAL HIGH (ref 70–99)
Potassium: 3.6 mEq/L — ABNORMAL LOW (ref 3.7–5.3)
Sodium: 139 mEq/L (ref 137–147)
TOTAL PROTEIN: 5.5 g/dL — AB (ref 6.0–8.3)
Total Bilirubin: <0.2 mg/dL — ABNORMAL LOW (ref 0.3–1.2)

## 2014-02-04 LAB — CBC
HCT: 30.8 % — ABNORMAL LOW (ref 36.0–46.0)
Hemoglobin: 10 g/dL — ABNORMAL LOW (ref 12.0–15.0)
MCH: 27.3 pg (ref 26.0–34.0)
MCHC: 32.5 g/dL (ref 30.0–36.0)
MCV: 84.2 fL (ref 78.0–100.0)
Platelets: 266 10*3/uL (ref 150–400)
RBC: 3.66 MIL/uL — ABNORMAL LOW (ref 3.87–5.11)
RDW: 15.3 % (ref 11.5–15.5)
WBC: 15.2 10*3/uL — ABNORMAL HIGH (ref 4.0–10.5)

## 2014-02-04 LAB — URIC ACID: URIC ACID, SERUM: 3.9 mg/dL (ref 2.4–7.0)

## 2014-02-04 NOTE — Lactation Note (Signed)
This note was copied from the chart of Bridget Morales. Lactation Consultation Note: Lactation and NICU Brochure given to mother. Staff sat up a DEBP and assist mother with initial pumping. Mother has breastmilk labels and yellow colostrum dots. Mother was taught hand expression . Obtained 4-5 ml of colostrum. Teaching on collections and storage of breastmilk. Mother breastfed her first two children for 7 months each. She has a 6 and 34 yr old. Both babies were term infants. Mother has never used an electric pump before. Parents have lots of questions and anxiety about LPI in NICU.  Support and encouragement given. Mother to continue to pump every 2-3 hours on Preemie setting. She is currently using a #24 flange. #27 flanges available for her to try when she returns form NICU.   I assist mother in NICU with proper support and latch technique. Infant placed in cross cradle on (L) breast . Mother taught to use firm support.  Infant sustained latch for 10-12 mins. Observed short burst of suckles with multiple swallows. Infant placed on alternate breast in football hold. Infant sustained latch for 15 mins. Infant again was observed with swallows. Infant was given 4 ml of EBM with a curved tip syringe while at the breast. Advised mother to offer breast and do frequent STS as NICU allows. Mother very happy that infant breastfed. Advised mother to phone [facility] to schedule a feeding assist.   Patient Name: Bridget Cayla Wiegand Today's Date: 02/04/2014 Reason for consult: Initial assessment   Maternal Data Formula Feeding for Exclusion: Yes Reason for exclusion: Admission to Intensive Care Unit (ICU) post-partum Infant to breast within first hour of birth: Yes Has patient been taught Hand Expression?: Yes Does the patient have breastfeeding experience prior to this delivery?: Yes  Feeding Feeding Type: Breast Milk Length of feed: 15 min (observed good depth with multiple swallows)  LATCH  Score/Interventions Latch: Grasps breast easily, tongue down, lips flanged, rhythmical sucking.  Audible Swallowing: A few with stimulation Intervention(s): Alternate breast massage  Type of Nipple: Everted at rest and after stimulation  Comfort (Breast/Nipple): Soft / non-tender     Hold (Positioning): Assistance needed to correctly position infant at breast and maintain latch. Intervention(s): Position options  LATCH Score: 8  Lactation Tools Discussed/Used     Consult Status Consult Status: Follow-up Date: 02/05/14 Follow-up type: In-patient    [facility] Aleksia Freiman 02/04/2014, 6:27 PM

## 2014-02-04 NOTE — H&P (Signed)
PPD#1 Pt doing well. No HA or vision changes. She has only light vaginal bleeding.She had normal labs this am.  Labetalol has been increased to 300mg  TID. Will continue this for now. Baby went to NICU because she had low blood sugars. Now doing well. IMP Stable.  PLAN/ Will stop MgSO4 and transfer to floor.

## 2014-02-04 NOTE — Progress Notes (Signed)
02/04/14 1510  Vitals  BP ! 163/76 mmHg  MAP (mmHg) 98  BP Location Right arm  BP Method Automatic  Patient Position, if appropriate Lying  Pulse Rate 90  Oxygen Therapy  SpO2 97 %  O2 Device None (Room air)  1400 po medication given. Pt has been in the NICU feeding baby.

## 2014-02-04 NOTE — Plan of Care (Signed)
Problem: Discharge Progression Outcomes Goal: Other Discharge Outcomes/Goals BP within ordered parameters Pt on adequate antihypertensive medications

## 2014-02-04 NOTE — Progress Notes (Signed)
AM labs drawn, pt remain awake in bed. Planned to visit NICU as soon as baby is awake. Encouraged to pump breast.

## 2014-02-05 LAB — RPR

## 2014-02-05 MED ORDER — LABETALOL HCL 200 MG PO TABS
400.0000 mg | ORAL_TABLET | Freq: Three times a day (TID) | ORAL | Status: DC
  Administered 2014-02-05 – 2014-02-06 (×3): 400 mg via ORAL
  Filled 2014-02-05 (×3): qty 2

## 2014-02-05 NOTE — Progress Notes (Signed)
Ur chart review completed.  

## 2014-02-05 NOTE — Progress Notes (Signed)
Post Partum Day 2 Subjective: no complaints, up ad lib, voiding, tolerating PO, + flatus and No HA, no blurry vision, no RUQ pain  Objective: Blood pressure 144/64, pulse 83, temperature 98.2 F (36.8 C), temperature source Oral, resp. rate 16, height 5\' 5"  (1.651 m), weight 97.297 kg (214 lb 8 oz), last menstrual period 12/07/2012, SpO2 97.00%, unknown if currently breastfeeding.  Physical Exam:  General: alert, cooperative and no distress Lochia: appropriate Uterine Fundus: firm DVT Evaluation: No evidence of DVT seen on physical exam. Negative Homan's sign. No cords or calf tenderness.   Recent Labs  02/03/14 0651 02/04/14 0530  HGB 13.3 10.0*  HCT 39.6 30.8*    Assessment/Plan: Patient with Pre-E s/p Magnesium sulfate BP were not well controlled on 900mg  of labetalol increased labetalol To 1200mg  Daily (400mg  TID). BP improved.  Patient w/o s/sx of severe pre-e If BP more controlled will discharge home tomorrow.  Baby in NICU for hypoglycemia   LOS: 3 days   [facility] 02/05/2014, 5:38 PM

## 2014-02-05 NOTE — Lactation Note (Signed)
This note was copied from the chart of Bridget Morales. Lactation Consultation Note       Follow up consult with this mom of a NICU baby, now 50 hours post partum, and 36 4/7 weeks corrected gestation.  Mom is doing great with pumping, every 2 hours during the day. She is breast feeding the baby in the NICU, but did not pump last night. i stressed the importance of pumping around the clock, even with brest feeding the baby. Mom did get engorged with her first child, so I reviewed breast care with mom ( ice, etc.). Mom knows to call for assistance with latching baby in the nICU, and any other questions/concerns.   Patient Name: Bridget Morales [id number]'L Date: 02/05/2014 Reason for consult: Follow-up assessment;NICU baby;Late preterm infant   Maternal Data Formula Feeding for Exclusion: Yes (baby in NICU)  Feeding Feeding Type: Breast Fed Length of feed: 20 min  LATCH Score/Interventions                      Lactation Tools Discussed/Used     Consult Status Consult Status: Follow-up Date: 02/06/14 Follow-up type: [place] 02/05/2014, 1:14 PM

## 2014-02-06 MED ORDER — DOCUSATE SODIUM 100 MG PO CAPS: 100.0000 mg | ORAL_CAPSULE | Freq: Two times a day (BID) | ORAL | Status: DC

## 2014-02-06 MED ORDER — LABETALOL HCL 200 MG PO TABS: 400.0000 mg | ORAL_TABLET | Freq: Three times a day (TID) | ORAL | Status: DC

## 2014-02-06 MED ORDER — IBUPROFEN 600 MG PO TABS: 600.0000 mg | ORAL_TABLET | Freq: Four times a day (QID) | ORAL | Status: DC | PRN

## 2014-02-06 MED ORDER — OXYCODONE-ACETAMINOPHEN 5-325 MG PO TABS: 2.0000 | ORAL_TABLET | ORAL | Status: DC | PRN

## 2014-02-06 NOTE — Progress Notes (Signed)
Pt discharged home with husband... Condition stable... No equipment... Pt planning to feed and visit baby in NICU and will leave to go home from there.

## 2014-02-06 NOTE — Discharge Summary (Signed)
Obstetric Discharge Summary Reason for Admission: induction of labor Prenatal Procedures: NST and ultrasound Intrapartum Procedures: spontaneous vaginal delivery Postpartum Procedures: Magnesium sulfate infusion, hypertension management, AICU  Complications-Operative and Postpartum: Pre-eclampsia Hemoglobin  Date Value Ref Range Status  02/04/2014 10.0* 12.0 - 15.0 g/dL Final     DELTA CHECK NOTED     REPEATED TO VERIFY     HCT  Date Value Ref Range Status  02/04/2014 30.8* 36.0 - 46.0 % Final    Physical Exam:  General: alert, cooperative and appears stated age 33: appropriate Uterine Fundus: firm  Discharge Diagnoses: Term Pregnancy-delivered and Preelampsia  Discharge Information: Date: 02/06/2014 Activity: pelvic rest Diet: routine Medications: Ibuprofen, Colace, Percocet and Labetalol Condition: improved Instructions: refer to practice specific booklet Discharge to: home Follow-up Information   Follow up with Bridget Millers, MD On 02/09/2014. (For a BP check)    Specialty:  Obstetrics and Gynecology   Contact information:   [address] [place] [address] [phone]       Newborn Data: Live born female  Birth Weight: 6 lb 13.7 oz (3110 g) APGAR: 9, 9  Home with NICU.  Bridget Morales. Bridget Morales 02/06/2014, 10:02 AM

## 2014-02-07 ENCOUNTER — Ambulatory Visit: Payer: Self-pay

## 2014-02-07 NOTE — Lactation Note (Signed)
This note was copied from the chart of Bridget Cairo Gau. Lactation Consultation Note    Follow up consult with this mom and 48 day old baby, in NICU, now 76 6/7 weeks corrected gestation. Baby is being discharged to home today. Mom has been breast feeding and pumping, feeding exclusive breast milk at this time. Mom knows to pump to comfort, and that she is able to come back for an o/p lactation consult prn as needed.  Patient Name: Bridget Morales [id number] Date: 02/07/2014 Reason for consult: Follow-up assessment;NICU baby   Maternal Data    Feeding Feeding Type: Breast Fed Length of feed: 20 min  LATCH Score/Interventions Latch: Grasps breast easily, tongue down, lips flanged, rhythmical sucking.  Audible Swallowing: Spontaneous and intermittent  Type of Nipple: Everted at rest and after stimulation  Comfort (Breast/Nipple): Soft / non-tender     Hold (Positioning): No assistance needed to correctly position infant at breast.  LATCH Score: 10  Lactation Tools Discussed/Used     Consult Status Consult Status: Complete Follow-up type: Call as needed    Tonna Corner 02/07/2014, 1:46 PM

## 2014-02-07 NOTE — Progress Notes (Signed)
Post discharge ur review completed. 

## 2014-08-06 ENCOUNTER — Encounter (HOSPITAL_COMMUNITY): Payer: Self-pay | Admitting: *Deleted

## 2015-01-28 IMAGING — US US OB COMP +14 WK
1 series · 12 of 28 positions shown · non-contrast
Comparison: none

[Series 1: us ob comp +14 wk · 0.30mm/px · 12 of 72 slices shown]
[im 3/72]
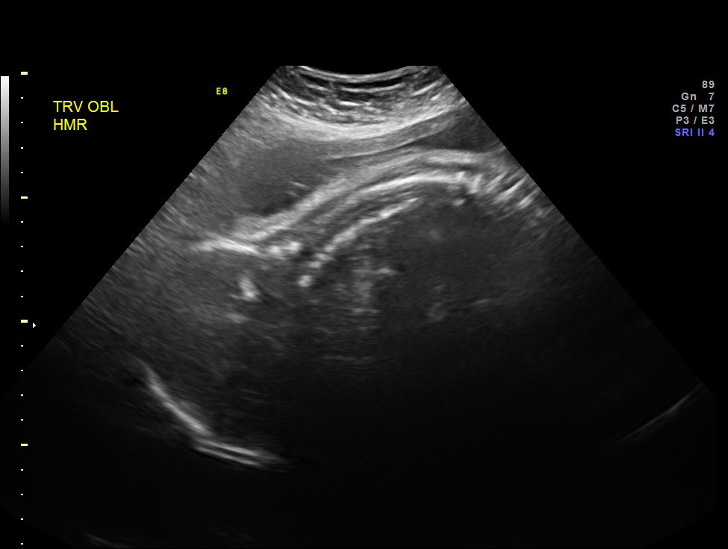
[im 8/72]
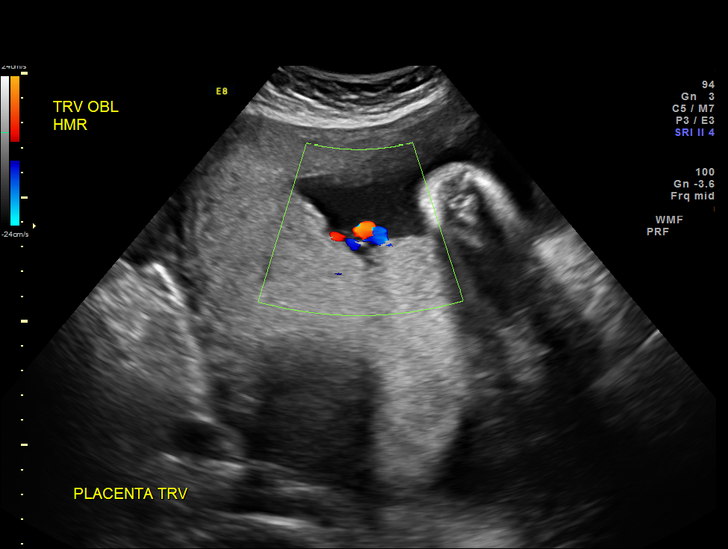
[im 14/72]
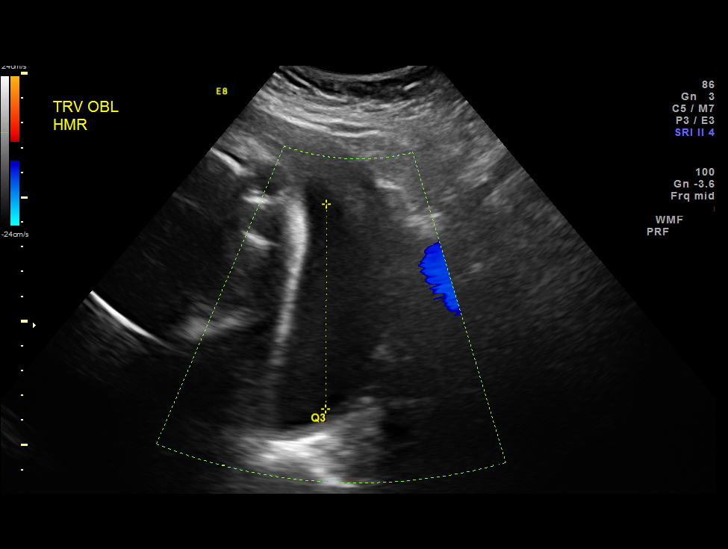
[im 22/72]
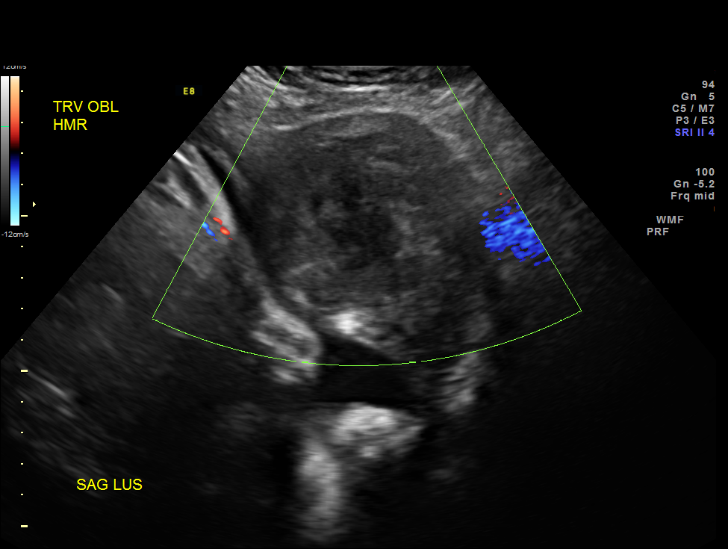
[im 27/72]
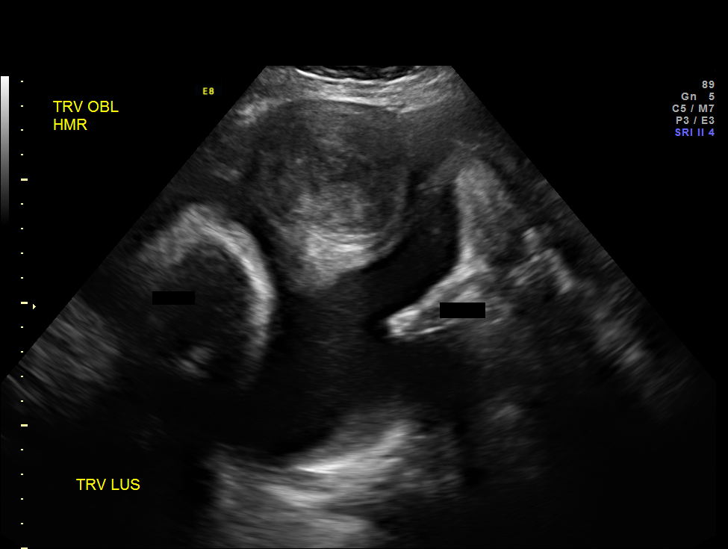
[im 32/72]
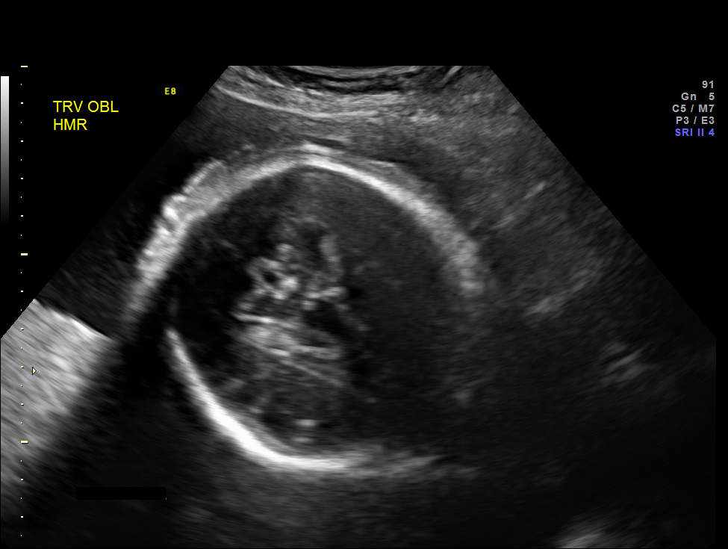
[im 40/72]
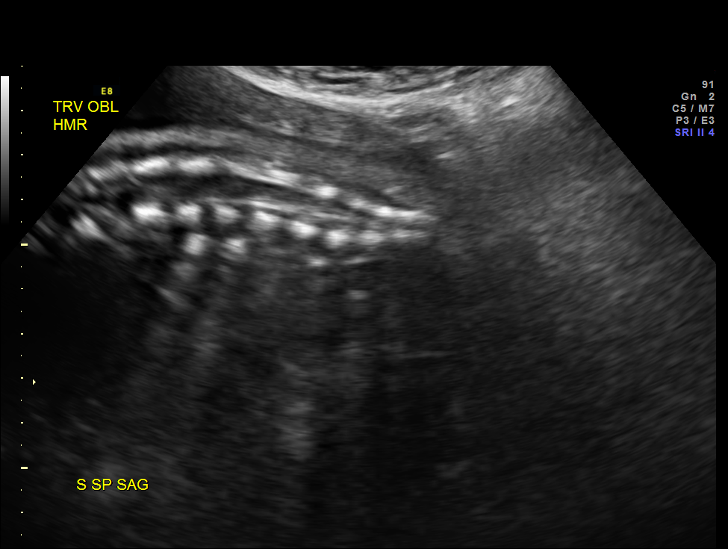
[im 45/72]
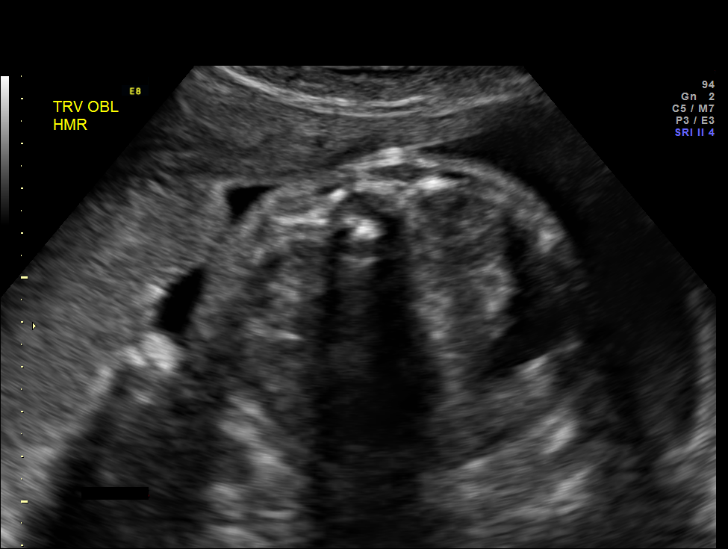
[im 50/72]
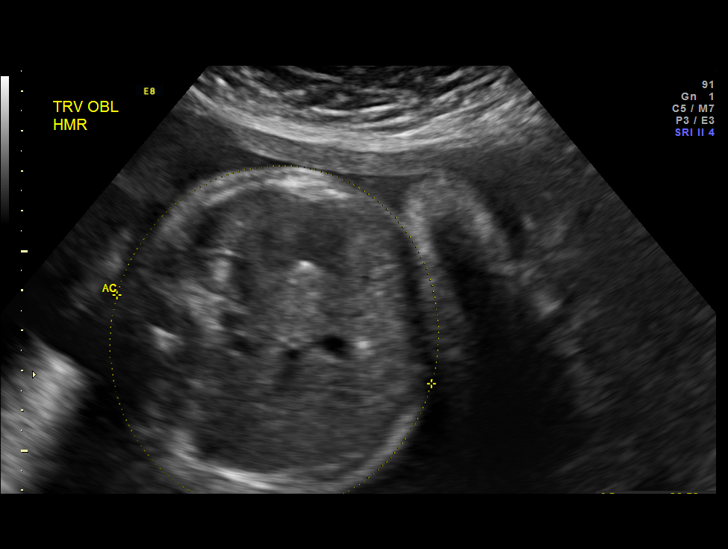
[im 58/72]
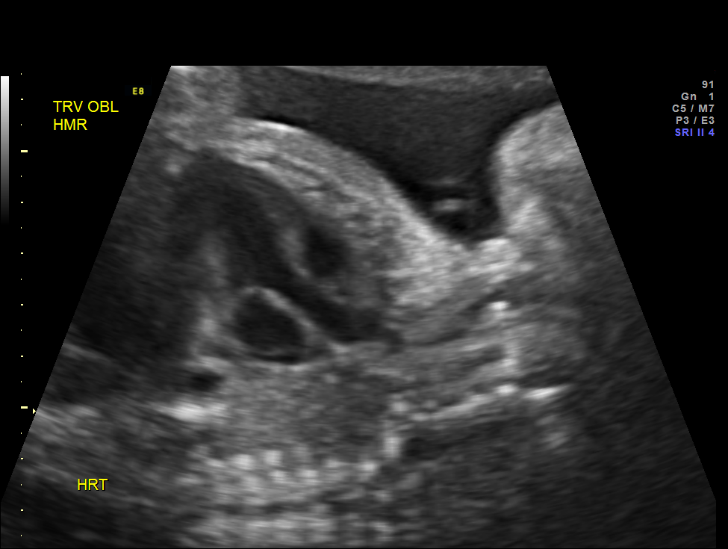
[im 64/72]
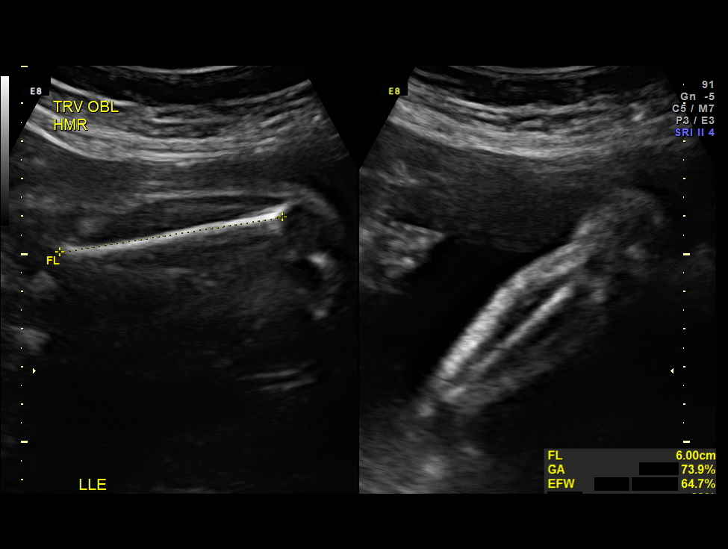
[im 69/72]
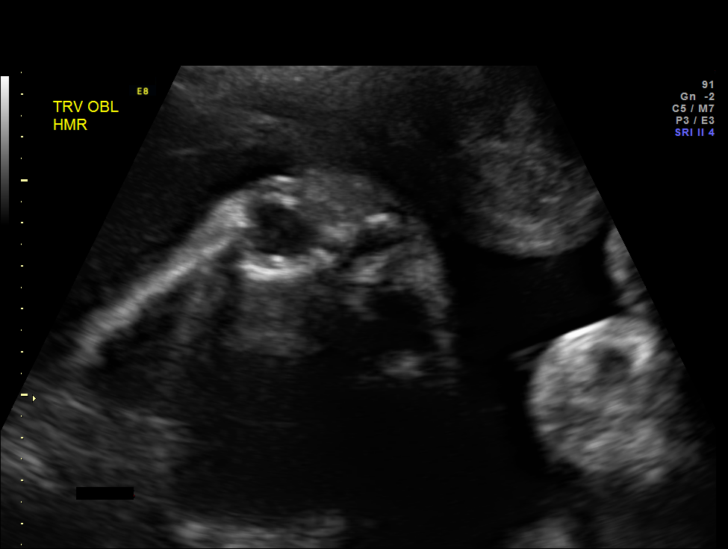

[12 of 28 positions shown; findings below may reference images not displayed]

OBSTETRICS REPORT
                      (Signed Final 12/20/2013 [DATE])

Service(s) Provided

 US OB COMP + 14 WK                                    76805.1
Indications

 Basic anatomic survey
 Hypertension - Chronic/Pre-existing
 Poor obstetric history: Previous gestational HTN
 Poor obstetric history: Previous preterm delivery
 (35 weeks)
 Uterine fibroids
Fetal Evaluation

 Num Of Fetuses:    1
 Fetal Heart Rate:  142                          bpm
 Cardiac Activity:  Observed
 Presentation:      Oblique, Head in RLQ
 Placenta:          Fundal, above cervical os
 P. Cord            Visualized, central
 Insertion:

 Amniotic Fluid
 AFI FV:      Polyhydramnios
 AFI Sum:     28.65   cm     > 97  %Tile     Larg Pckt:    9.54  cm
 RUQ:   6.29    cm   RLQ:    9.54   cm    LUQ:   4.54    cm   LLQ:    8.28   cm
Biometry

 BPD:     73.7  mm     G. Age:  29w 4d                CI:         73.4   70 - 86
 OFD:    100.4  mm                                    FL/HC:      21.7   19.2 -

 HC:       281  mm     G. Age:  30w 6d       37  %    HC/AC:      1.06   0.99 -

 AC:     264.2  mm     G. Age:  30w 4d       61  %    FL/BPD:     82.9   71 - 87
 FL:      61.1  mm     G. Age:  31w 5d       81  %    FL/AC:      23.1   20 - 24
 HUM:     53.1  mm     G. Age:  31w 0d       69  %
 CER:     36.1  mm     G. Age:  31w 1d       62  %

 Est. FW:    3754  gm    3 lb 10 oz      69  %
Gestational Age

 LMP:           30w 0d        Date:  05/24/13                 EDD:   02/28/14
 U/S Today:     30w 5d                                        EDD:   02/23/14
 Best:          30w 0d     Det. By:  LMP  (05/24/13)          EDD:   02/28/14
Anatomy

 Cranium:          Appears normal         Aortic Arch:      Not well visualized
 Fetal Cavum:      Appears normal         Ductal Arch:      Not well visualized
 Ventricles:       Appears normal         Diaphragm:        Appears normal
 Choroid Plexus:   Appears normal         Stomach:          Appears normal
 Cerebellum:       Appears normal         Abdomen:          Appears normal
 Posterior Fossa:  Appears normal         Abdominal Wall:   Not well visualized
 Nuchal Fold:      Not applicable (>20    Cord Vessels:     Appears normal (3
                   wks GA)                                  vessel cord)
 Face:             Not well visualized    Kidneys:          Appear normal
 Lips:             Appears normal         Bladder:          Appears normal
 Heart:            Appears normal         Spine:            Appears normal
                   (4CH, axis, and
                   situs)
 RVOT:             Appears normal         Lower             Visualized
                                          Extremities:
 LVOT:             Appears normal         Upper             Visualized
                                          Extremities:

 Other:  Fetus appears to be a female. Technically difficult due to maternal
         habitus and fetal position.
Targeted Anatomy

 Fetal Central Nervous System
 Cisterna Magna:
Cervix Uterus Adnexa

 Cervix:       Normal appearance by transabdominal scan.

 Adnexa:     No abnormality visualized.
Myomas

 Site                     L(cm)      W(cm)       D(cm)      Location
 Posterior Fundus         5.1        7
 Fundus Right             3.2        4
 Anterior LUS
 Anterior LUS             4.1        5

 Blood Flow                  RI       PI       Comments

Impression

 Single IUP at 30 0/7 weeks
 Chronic vs. Gestational hypertesion
 Fetal growth is appropriate (69th %tile)
 Multiple uterine fibroids noted as above.
 A large anterior lower uterine segment myoma was noted
 measuring 6.6 x 7.2 x 6.4 cm that may obstruct the birth
 canal.
 Mild polyhydramnios is noted (AFI 28.7 cm)
Recommendations

 See separate inpatient consult
 Recommend serial growth scans everly 3-4 weeks- please
 contact our office if you would prefer that these studies be
 performed here
 2x weekly NSTs with weekly AFIs begining at 32 weeks.

## 2016-06-04 ENCOUNTER — Other Ambulatory Visit (HOSPITAL_COMMUNITY): Payer: Self-pay | Admitting: Obstetrics and Gynecology

## 2016-06-04 DIAGNOSIS — Z331 Pregnant state, incidental: Secondary | ICD-10-CM

## 2016-06-04 DIAGNOSIS — O26859 Spotting complicating pregnancy, unspecified trimester: Secondary | ICD-10-CM

## 2016-06-04 DIAGNOSIS — T8331XA Breakdown (mechanical) of intrauterine contraceptive device, initial encounter: Secondary | ICD-10-CM

## 2016-06-04 DIAGNOSIS — O9989 Other specified diseases and conditions complicating pregnancy, childbirth and the puerperium: Secondary | ICD-10-CM

## 2016-06-05 ENCOUNTER — Ambulatory Visit (HOSPITAL_COMMUNITY)
Admission: RE | Admit: 2016-06-05 | Discharge: 2016-06-05 | Disposition: A | Discharge status: Home / Self Care | Payer: BLUE CROSS/BLUE SHIELD | Source: Ambulatory Visit | Attending: Obstetrics and Gynecology | Admitting: Obstetrics and Gynecology

## 2016-06-05 ENCOUNTER — Inpatient Hospital Stay (HOSPITAL_COMMUNITY): Payer: BLUE CROSS/BLUE SHIELD | Admitting: Anesthesiology

## 2016-06-05 ENCOUNTER — Other Ambulatory Visit: Payer: Self-pay | Admitting: Obstetrics and Gynecology

## 2016-06-05 ENCOUNTER — Ambulatory Visit (HOSPITAL_COMMUNITY)
Admission: AD | Admit: 2016-06-05 | Discharge: 2016-06-05 | Disposition: A | Discharge status: Home / Self Care | Payer: BLUE CROSS/BLUE SHIELD | Source: Ambulatory Visit | Attending: Obstetrics and Gynecology | Admitting: Obstetrics and Gynecology

## 2016-06-05 ENCOUNTER — Encounter (HOSPITAL_COMMUNITY): Payer: Self-pay

## 2016-06-05 ENCOUNTER — Encounter (HOSPITAL_COMMUNITY)
Admission: AD | Disposition: A | Discharge status: Home / Self Care | Payer: Self-pay | Source: Ambulatory Visit | Attending: Obstetrics and Gynecology

## 2016-06-05 DIAGNOSIS — O3410 Maternal care for benign tumor of corpus uteri, unspecified trimester: Secondary | ICD-10-CM | POA: Insufficient documentation

## 2016-06-05 DIAGNOSIS — R938 Abnormal findings on diagnostic imaging of other specified body structures: Secondary | ICD-10-CM | POA: Insufficient documentation

## 2016-06-05 DIAGNOSIS — Z3A00 Weeks of gestation of pregnancy not specified: Secondary | ICD-10-CM | POA: Insufficient documentation

## 2016-06-05 DIAGNOSIS — O2630 Retained intrauterine contraceptive device in pregnancy, unspecified trimester: Secondary | ICD-10-CM | POA: Insufficient documentation

## 2016-06-05 DIAGNOSIS — O9989 Other specified diseases and conditions complicating pregnancy, childbirth and the puerperium: Secondary | ICD-10-CM

## 2016-06-05 DIAGNOSIS — K661 Hemoperitoneum: Secondary | ICD-10-CM | POA: Diagnosis not present

## 2016-06-05 DIAGNOSIS — O00109 Unspecified tubal pregnancy without intrauterine pregnancy: Secondary | ICD-10-CM

## 2016-06-05 DIAGNOSIS — T8331XA Breakdown (mechanical) of intrauterine contraceptive device, initial encounter: Secondary | ICD-10-CM

## 2016-06-05 DIAGNOSIS — R1909 Other intra-abdominal and pelvic swelling, mass and lump: Secondary | ICD-10-CM | POA: Diagnosis not present

## 2016-06-05 DIAGNOSIS — O26859 Spotting complicating pregnancy, unspecified trimester: Secondary | ICD-10-CM

## 2016-06-05 DIAGNOSIS — Z975 Presence of (intrauterine) contraceptive device: Secondary | ICD-10-CM | POA: Insufficient documentation

## 2016-06-05 DIAGNOSIS — O001 Tubal pregnancy without intrauterine pregnancy: Secondary | ICD-10-CM | POA: Insufficient documentation

## 2016-06-05 DIAGNOSIS — O209 Hemorrhage in early pregnancy, unspecified: Secondary | ICD-10-CM | POA: Insufficient documentation

## 2016-06-05 HISTORY — PX: LAPAROSCOPY: SHX197

## 2016-06-05 HISTORY — PX: UNILATERAL SALPINGECTOMY: SHX6160

## 2016-06-05 LAB — TYPE AND SCREEN
ABO/RH(D): O POS
Antibody Screen: NEGATIVE

## 2016-06-05 LAB — CBC
HEMATOCRIT: 37.3 % (ref 36.0–46.0)
Hemoglobin: 12.6 g/dL (ref 12.0–15.0)
MCH: 27.6 pg (ref 26.0–34.0)
MCHC: 33.8 g/dL (ref 30.0–36.0)
MCV: 81.8 fL (ref 78.0–100.0)
Platelets: 311 10*3/uL (ref 150–400)
RBC: 4.56 MIL/uL (ref 3.87–5.11)
RDW: 14.8 % (ref 11.5–15.5)
WBC: 12.3 10*3/uL — ABNORMAL HIGH (ref 4.0–10.5)

## 2016-06-05 LAB — HCG, QUANTITATIVE, PREGNANCY: HCG, BETA CHAIN, QUANT, S: 251 m[IU]/mL — AB (ref <5)

## 2016-06-05 SURGERY — LAPAROSCOPY, DIAGNOSTIC
Anesthesia: General | Site: Abdomen

## 2016-06-05 MED ORDER — BUPIVACAINE HCL (PF) 0.25 % IJ SOLN
INTRAMUSCULAR | Status: DC | PRN
  Administered 2016-06-05: 10 mL

## 2016-06-05 MED ORDER — MIDAZOLAM HCL 2 MG/2ML IJ SOLN
INTRAMUSCULAR | Status: DC | PRN
  Administered 2016-06-05: 2 mg via INTRAVENOUS

## 2016-06-05 MED ORDER — PROMETHAZINE HCL 25 MG/ML IJ SOLN: 6.2500 mg | INTRAMUSCULAR | Status: DC | PRN

## 2016-06-05 MED ORDER — LACTATED RINGERS IR SOLN
Status: DC | PRN
  Administered 2016-06-05: 3000 mL

## 2016-06-05 MED ORDER — HYDROCODONE-ACETAMINOPHEN 5-325 MG PO TABS: 1.0000 | ORAL_TABLET | Freq: Four times a day (QID) | ORAL | 0 refills | Status: DC | PRN

## 2016-06-05 MED ORDER — FENTANYL CITRATE (PF) 100 MCG/2ML IJ SOLN
INTRAMUSCULAR | Status: DC | PRN
  Administered 2016-06-05: 100 ug via INTRAVENOUS
  Administered 2016-06-05 (×3): 50 ug via INTRAVENOUS

## 2016-06-05 MED ORDER — ONDANSETRON HCL 4 MG/2ML IJ SOLN
INTRAMUSCULAR | Status: DC | PRN
  Administered 2016-06-05: 4 mg via INTRAVENOUS

## 2016-06-05 MED ORDER — SUGAMMADEX SODIUM 200 MG/2ML IV SOLN
INTRAVENOUS | Status: DC | PRN
  Administered 2016-06-05: 192.4 mg via INTRAVENOUS

## 2016-06-05 MED ORDER — LACTATED RINGERS IV SOLN
INTRAVENOUS | Status: DC
  Administered 2016-06-05 (×2): via INTRAVENOUS

## 2016-06-05 MED ORDER — HYDRALAZINE HCL 20 MG/ML IJ SOLN
5.0000 mg | Freq: Once | INTRAMUSCULAR | Status: AC
  Administered 2016-06-05: 5 mg via INTRAVENOUS

## 2016-06-05 MED ORDER — FENTANYL CITRATE (PF) 100 MCG/2ML IJ SOLN
INTRAMUSCULAR | Status: AC
  Filled 2016-06-05: qty 2

## 2016-06-05 MED ORDER — LACTATED RINGERS IV BOLUS (SEPSIS)
1000.0000 mL | Freq: Once | INTRAVENOUS | Status: AC
  Administered 2016-06-05: 1000 mL via INTRAVENOUS

## 2016-06-05 MED ORDER — HYDROCODONE-ACETAMINOPHEN 5-325 MG PO TABS
1.0000 | ORAL_TABLET | Freq: Once | ORAL | Status: AC
  Administered 2016-06-05: 1 via ORAL

## 2016-06-05 MED ORDER — HYDRALAZINE HCL 20 MG/ML IJ SOLN
INTRAMUSCULAR | Status: AC
  Filled 2016-06-05: qty 1

## 2016-06-05 MED ORDER — FAMOTIDINE IN NACL 20-0.9 MG/50ML-% IV SOLN
20.0000 mg | Freq: Once | INTRAVENOUS | Status: AC
  Administered 2016-06-05: 20 mg via INTRAVENOUS
  Filled 2016-06-05: qty 50

## 2016-06-05 MED ORDER — MIDAZOLAM HCL 2 MG/2ML IJ SOLN
INTRAMUSCULAR | Status: AC
  Filled 2016-06-05: qty 2

## 2016-06-05 MED ORDER — FENTANYL CITRATE (PF) 100 MCG/2ML IJ SOLN
25.0000 ug | INTRAMUSCULAR | Status: DC | PRN
  Administered 2016-06-05: 50 ug via INTRAVENOUS

## 2016-06-05 MED ORDER — KETOROLAC TROMETHAMINE 30 MG/ML IJ SOLN
INTRAMUSCULAR | Status: DC | PRN
  Administered 2016-06-05: 30 mg via INTRAVENOUS

## 2016-06-05 MED ORDER — PROPOFOL 10 MG/ML IV BOLUS
INTRAVENOUS | Status: DC | PRN
  Administered 2016-06-05: 200 mg via INTRAVENOUS

## 2016-06-05 MED ORDER — SUCCINYLCHOLINE CHLORIDE 20 MG/ML IJ SOLN
INTRAMUSCULAR | Status: DC | PRN
  Administered 2016-06-05: 120 mg via INTRAVENOUS

## 2016-06-05 MED ORDER — IBUPROFEN 800 MG PO TABS: 800.0000 mg | ORAL_TABLET | Freq: Three times a day (TID) | ORAL | 2 refills | Status: DC | PRN

## 2016-06-05 MED ORDER — LIDOCAINE HCL (CARDIAC) 20 MG/ML IV SOLN
INTRAVENOUS | Status: DC | PRN
  Administered 2016-06-05: 100 mg via INTRAVENOUS

## 2016-06-05 MED ORDER — LABETALOL HCL 5 MG/ML IV SOLN
5.0000 mg | Freq: Once | INTRAVENOUS | Status: AC
  Administered 2016-06-05: 5 mg via INTRAVENOUS

## 2016-06-05 MED ORDER — SOD CITRATE-CITRIC ACID 500-334 MG/5ML PO SOLN
30.0000 mL | Freq: Once | ORAL | Status: AC
  Administered 2016-06-05: 30 mL via ORAL
  Filled 2016-06-05: qty 15

## 2016-06-05 MED ORDER — DEXAMETHASONE SODIUM PHOSPHATE 10 MG/ML IJ SOLN
INTRAMUSCULAR | Status: DC | PRN
  Administered 2016-06-05: 4 mg via INTRAVENOUS

## 2016-06-05 MED ORDER — LABETALOL HCL 5 MG/ML IV SOLN
INTRAVENOUS | Status: AC
  Administered 2016-06-05: 5 mg via INTRAVENOUS
  Filled 2016-06-05: qty 4

## 2016-06-05 MED ORDER — ROCURONIUM BROMIDE 100 MG/10ML IV SOLN
INTRAVENOUS | Status: DC | PRN
  Administered 2016-06-05: 10 mg via INTRAVENOUS
  Administered 2016-06-05: 30 mg via INTRAVENOUS

## 2016-06-05 MED ORDER — HYDROCODONE-ACETAMINOPHEN 5-325 MG PO TABS
ORAL_TABLET | ORAL | Status: AC
  Administered 2016-06-05: 1 via ORAL
  Filled 2016-06-05: qty 1

## 2016-06-05 MED ORDER — LABETALOL HCL 5 MG/ML IV SOLN
5.0000 mg | INTRAVENOUS | Status: AC | PRN
  Administered 2016-06-05 (×4): 5 mg via INTRAVENOUS

## 2016-06-05 SURGICAL SUPPLY — 31 items
APPLICATOR COTTON TIP 6IN STRL (MISCELLANEOUS) IMPLANT
CABLE HIGH FREQUENCY MONO STRZ (ELECTRODE) IMPLANT
CATH ROBINSON RED A/P 16FR (CATHETERS) ×4 IMPLANT
CLOTH BEACON ORANGE TIMEOUT ST (SAFETY) ×4 IMPLANT
DRSG COVADERM PLUS 2X2 (GAUZE/BANDAGES/DRESSINGS) IMPLANT
DRSG OPSITE POSTOP 3X4 (GAUZE/BANDAGES/DRESSINGS) ×4 IMPLANT
ELECT LIGASURE LONG (ELECTRODE) IMPLANT
FORCEPS CUTTING 33CM 5MM (CUTTING FORCEPS) ×4 IMPLANT
FORCEPS CUTTING 45CM 5MM (CUTTING FORCEPS) IMPLANT
GLOVE BIOGEL PI IND STRL 7.0 (GLOVE) ×4 IMPLANT
GLOVE BIOGEL PI INDICATOR 7.0 (GLOVE) ×4
GLOVE ECLIPSE 6.5 STRL STRAW (GLOVE) ×4 IMPLANT
GOWN STRL REUS W/TWL LRG LVL3 (GOWN DISPOSABLE) ×12 IMPLANT
LIQUID BAND (GAUZE/BANDAGES/DRESSINGS) ×4 IMPLANT
NEEDLE INSUFFLATION 120MM (ENDOMECHANICALS) ×4 IMPLANT
NEEDLE INSUFFLATION 150MM (ENDOMECHANICALS) ×4 IMPLANT
NS IRRIG 1000ML POUR BTL (IV SOLUTION) ×4 IMPLANT
PACK LAPAROSCOPY BASIN (CUSTOM PROCEDURE TRAY) ×4 IMPLANT
PAD TRENDELENBURG POSITION (MISCELLANEOUS) ×4 IMPLANT
SCISSORS LAP 5X35 DISP (ENDOMECHANICALS) IMPLANT
SET IRRIG TUBING LAPAROSCOPIC (IRRIGATION / IRRIGATOR) ×4 IMPLANT
SLEEVE XCEL OPT CAN 5 100 (ENDOMECHANICALS) ×4 IMPLANT
SOLUTION ELECTROLUBE (MISCELLANEOUS) IMPLANT
SUT VICRYL 0 UR6 27IN ABS (SUTURE) ×4 IMPLANT
SUT VICRYL 4-0 PS2 18IN ABS (SUTURE) ×4 IMPLANT
TOWEL OR 17X24 6PK STRL BLUE (TOWEL DISPOSABLE) ×8 IMPLANT
TROCAR BALLN 12MMX100 BLUNT (TROCAR) IMPLANT
TROCAR OPTI TIP 5M 100M (ENDOMECHANICALS) ×4 IMPLANT
TROCAR XCEL DIL TIP R 11M (ENDOMECHANICALS) ×8 IMPLANT
WARMER LAPAROSCOPE (MISCELLANEOUS) ×4 IMPLANT
WATER STERILE IRR 1000ML POUR (IV SOLUTION) IMPLANT

## 2016-06-05 NOTE — Anesthesia Postprocedure Evaluation (Signed)
Anesthesia Post Note  Patient: Bridget Morales  Procedure(s) Performed: Procedure(s) (LRB): LAPAROSCOPY DIAGNOSTIC (N/A) UNILATERAL SALPINGECTOMY (Left)  Patient location during evaluation: PACU Anesthesia Type: General Level of consciousness: awake and alert Pain management: pain level controlled Vital Signs Assessment: post-procedure vital signs reviewed and stable Respiratory status: spontaneous breathing, nonlabored ventilation, respiratory function stable and patient connected to nasal cannula oxygen Cardiovascular status: blood pressure returned to baseline and stable Postop Assessment: no signs of nausea or vomiting Anesthetic complications: no Comments: BP 160/84. I switched to a large cuff given the patients cone shaped arm and obesity. This was the appropriate size. She reports that she is chronically HTN in the 170s and related to anxiety and is supposed to be taking labetalol but does not take this daily. I expressed my concern for her long term health and she told me she will call tomorrow for an appointment with her primary care physician. She denies any current symptoms, pain is a 2/10 in her abdomen, no headache, dizziness, blurry vision.      Last Vitals:  Vitals:   06/05/16 1945 06/05/16 2000  BP: (!) 171/89 (!) 174/98  Pulse: (!) 101 (!) 108  Resp: 17 (!) 22  Temp:      Last Pain:  Vitals:   06/05/16 1945  PainSc: 5    Pain Goal: Patients Stated Pain Goal: 4 (06/05/16 1845)               Fay Swider JENNETTE

## 2016-06-05 NOTE — Anesthesia Preprocedure Evaluation (Signed)
Anesthesia Evaluation  Patient identified by MRN, date of birth, ID band Patient awake    Reviewed: Allergy & Precautions, NPO status , Patient's Chart, lab work & pertinent test results  Airway Mallampati: II  TM Distance: >3 FB Neck ROM: Full    Dental no notable dental hx.    Pulmonary neg pulmonary ROS,    Pulmonary exam normal breath sounds clear to auscultation       Cardiovascular hypertension, negative cardio ROS Normal cardiovascular exam Rhythm:Regular Rate:Normal     Neuro/Psych negative neurological ROS  negative psych ROS   GI/Hepatic negative GI ROS, Neg liver ROS,   Endo/Other  negative endocrine ROS  Renal/GU negative Renal ROS  negative genitourinary   Musculoskeletal negative musculoskeletal ROS (+)   Abdominal   Peds negative pediatric ROS (+)  Hematology negative hematology ROS (+)   Anesthesia Other Findings   Reproductive/Obstetrics negative OB ROS                             Anesthesia Physical  Anesthesia Plan  ASA: II  Anesthesia Plan: General   Post-op Pain Management:    Induction: Intravenous  Airway Management Planned: Oral ETT  Additional Equipment:   Intra-op Plan:   Post-operative Plan: Extubation in OR  Informed Consent: I have reviewed the patients History and Physical, chart, labs and discussed the procedure including the risks, benefits and alternatives for the proposed anesthesia with the patient or authorized representative who has indicated his/her understanding and acceptance.   Dental advisory given  Plan Discussed with: CRNA  Anesthesia Plan Comments:         Anesthesia Quick Evaluation  

## 2016-06-05 NOTE — Brief Op Note (Signed)
06/05/2016  6:44 PM  PATIENT:  Bridget Morales  35 y.o. female  PRE-OPERATIVE DIAGNOSIS:   Left ectopic pregnancy, Fibroid uterus, IUD pregnancy  POST-OPERATIVE DIAGNOSIS:  Ruptured left ectopic pregnancy, Fibroid uterus, IUD pregnancy, Right ovarian cyst  PROCEDURE:  Diagnostic laparoscopy, left salpingectomy, evacuation of hemoperitoneum  SURGEON:  Surgeon(s) and Role:    * Servando Salina, MD - Primary  PHYSICIAN ASSISTANT:   ASSISTANTS: Laury Deep, CNM   ANESTHESIA:   general Findings: IUD string visible, large fibroid uterus, right tube nl, right ovary with distal ovarian cyst, nl appendix,  Left ovary encased in clotted blood and hemoperitoneum, left tube with distal clots, nl liver edge, blood in upper abdomen  EBL:  Total I/O In: 1200 [I.V.:1200] Out: 505 [Urine:500; Blood:5] Hemoperitoneum : 150 cc BLOOD ADMINISTERED:none  DRAINS: none   LOCAL MEDICATIONS USED:  MARCAINE     SPECIMEN:  Source of Specimen:  left tube and ectopic  DISPOSITION OF SPECIMEN:  PATHOLOGY  COUNTS:  YES  TOURNIQUET:  * No tourniquets in log *  DICTATION: .Other Dictation: Dictation Number [id number]  PLAN OF CARE: Discharge to home after PACU  PATIENT DISPOSITION:  PACU - hemodynamically stable.   Delay start of Pharmacological VTE agent (>24hrs) due to surgical blood loss or risk of bleeding: no

## 2016-06-05 NOTE — Discharge Instructions (Addendum)
Ectopic Pregnancy An ectopic pregnancy happens when a fertilized egg grows outside the uterus. A pregnancy cannot live outside of the uterus. This problem often happens in the fallopian tube. It is often caused by damage to the fallopian tube. If this problem is found early, you may be treated with medicine. If your tube tears or bursts open (ruptures), you will bleed inside. This is an emergency. You will need surgery. Get help right away.  SYMPTOMS You may have normal pregnancy symptoms at first. These include:  Missing your period.  Feeling sick to your stomach (nauseous).  Being tired.  Having tender breasts. Then, you may start to have symptoms that are not normal. These include:  Pain with sex (intercourse).  Bleeding from the vagina. This includes light bleeding (spotting).  Belly (abdomen) or lower belly cramping or pain. This may be felt on one side.  A fast heartbeat (pulse).  Passing out (fainting) after going poop (bowel movement). If your tube tears, you may have symptoms such as:  Really bad pain in the belly or lower belly. This happens suddenly.  Dizziness.  Passing out.  Shoulder pain. GET HELP RIGHT AWAY IF:  You have any of these symptoms. This is an emergency. MAKE SURE YOU:  Understand these instructions.  Will watch your condition.  Will get help right away if you are not doing well or get worse.   This information is not intended to replace advice given to you by your health care provider. Make sure you discuss any questions you have with your health care provider.   Document Released: 12/18/2008 Document Revised: 09/26/2013 Document Reviewed: 05/03/2013 Elsevier Interactive Patient Education 2016 [place] Anesthesia, Adult, Care After Refer to this sheet in the next few weeks. These instructions provide you with information on caring for yourself after your procedure. Your health care provider may also give you more  specific instructions. Your treatment has been planned according to current medical practices, but problems sometimes occur. Call your health care provider if you have any problems or questions after your procedure. WHAT TO EXPECT AFTER THE PROCEDURE After the procedure, it is typical to experience: Sleepiness. Nausea and vomiting. HOME CARE INSTRUCTIONS For the first 24 hours after general anesthesia: Have a responsible person with you. Do not drive a car. If you are alone, do not take public transportation. Do not drink alcohol. Do not take medicine that has not been prescribed by your health care provider. Do not sign important papers or make important decisions. You may resume a normal diet and activities as directed by your health care provider. Change bandages (dressings) as directed. If you have questions or problems that seem related to general anesthesia, call the hospital and ask for the anesthetist or anesthesiologist on call. SEEK MEDICAL CARE IF: You have nausea and vomiting that continue the day after anesthesia. You develop a rash. SEEK IMMEDIATE MEDICAL CARE IF:  You have difficulty breathing. You have chest pain. You have any allergic problems.   This information is not intended to replace advice given to you by your health care provider. Make sure you discuss any questions you have with your health care provider.   Document Released: 12/28/2000 Document Revised: 10/12/2014 Document Reviewed: 01/20/2012 Elsevier Interactive Patient Education 2016 [place]  IF TEMP>100.4, NOTHING PER VAGINA X 2 WK, CALL IF SOAKING A MAXI  PAD EVERY HOUR OR MORE FREQUENTLY. DO NOT TAKE MOTRIN UNTIL AFTER 2AM TONIGHT.  REMOVE HONEYCOMB (belly button) dressing after 2 days.  DO NOT SHOWER UNTIL TOMORROW NIGH. NO HOT TUBS, BATHS, SEX, TAMPONS OR DOUCHING FOR 2 WEEKS.

## 2016-06-05 NOTE — Transfer of Care (Signed)
Immediate Anesthesia Transfer of Care Note  Patient: Bridget Morales  Procedure(s) Performed: Procedure(s) with comments: LAPAROSCOPY DIAGNOSTIC (N/A) - ectopic pregnancy  UNILATERAL SALPINGECTOMY (Left)  Patient Location: PACU  Anesthesia Type:General  Level of Consciousness: awake, alert  and oriented  Airway & Oxygen Therapy: Patient Spontanous Breathing and Patient connected to nasal cannula oxygen  Post-op Assessment: Report given to RN, Post -op Vital signs reviewed and stable and Patient moving all extremities  Post vital signs: Reviewed and stable  Last Vitals:  Vitals:   06/05/16 1125 06/05/16 1203  BP: 172/89 163/87  Pulse: 112   Resp: 20   Temp: 37.4 C     Last Pain: There were no vitals filed for this visit.       Complications: No apparent anesthesia complications

## 2016-06-05 NOTE — OR Nursing (Signed)
Patient has large bruised are right lower abdomen.

## 2016-06-05 NOTE — H&P (Signed)
Bridget Morales is an 35 y.o. female G4P3003 BF LMP 7/17 presents for surgical management of presumed left ectopic( 5cm) not amenable to MTX treatment. Pt has had paragard IUD. She has had regular cycle until this month when she started having vaginal spotting and on and off pain on left side for the week. Pt is currently without pain. Known uterine fibroids. Hquants 455 on Monday, 428 on Wednesday. Due to her large fibroid uterus, left adnexal mass ( 3.6 cm) could not be truly quantified( no peripheral blood flow)  Pertinent Gynecological History: Menses: regular Bleeding: reg cycle Contraception: IUD DES exposure: denies Blood transfusions: none Sexually transmitted diseases: no past history Previous GYN Procedures: none  Last mammogram: n/a Date: n/a Last pap: normal Date:12/2014 OB History: G4, P3   Menstrual History: Menarche age: n/a Patient's last menstrual period was 04/27/2016 (approximate).    Past Medical History:  Diagnosis Date  . Fibroid   . Pregnancy induced hypertension   . Previous pregnancy complicated by pregnancy-induced hypertension, antepartum     Past Surgical History:  Procedure Laterality Date  . NO PAST SURGERIES    . WISDOM TOOTH EXTRACTION      Family History  Problem Relation Age of Onset  . Diabetes Father   . Diabetes Maternal Grandmother   . Cancer Maternal Grandmother     breast    Social History:  reports that she has never smoked. She has never used smokeless tobacco. She reports that she does not drink alcohol or use drugs.  Allergies: No Known Allergies  Prescriptions Prior to Admission  Medication Sig Dispense Refill Last Dose  . docusate sodium (COLACE) 100 MG capsule Take 1 capsule (100 mg total) by mouth 2 (two) times daily. (Patient not taking: Reported on 06/05/2016) 60 capsule 0 Not Taking at Unknown time  . ibuprofen (ADVIL,MOTRIN) 600 MG tablet Take 1 tablet (600 mg total) by mouth every 6 (six) hours as needed. (Patient not  taking: Reported on 06/05/2016) 90 tablet 0 Not Taking at Unknown time  . labetalol (NORMODYNE) 200 MG tablet Take 2 tablets (400 mg total) by mouth 3 (three) times daily. (Patient not taking: Reported on 06/05/2016) 180 tablet 1 Not Taking at Unknown time    Review of Systems  All other systems reviewed and are negative.   Blood pressure 163/87, pulse 112, temperature 99.4 F (37.4 C), resp. rate 20, height 5\' 6"  (1.676 m), weight 96.2 kg (212 lb), last menstrual period 04/27/2016, unknown if currently breastfeeding. Physical Exam  Constitutional: She is oriented to person, place, and time. She appears well-developed and well-nourished.  HENT:  Head: Atraumatic.  Eyes: EOM are normal.  Neck: Neck supple.  Cardiovascular: Normal rate and regular rhythm.   Respiratory: Breath sounds normal.  GI: Soft.  Obese nontender  Genitourinary:  Genitourinary Comments: Pelvic deferred today. IUD in place on sonogram and string seen on exam monday  Musculoskeletal: She exhibits no edema.  Neurological: She is alert and oriented to person, place, and time.  Skin: Skin is warm and dry.  Psychiatric: She has a normal mood and affect.    Results for orders placed or performed during the hospital encounter of 06/05/16 (from the past 24 hour(s))  CBC     Status: Abnormal   Collection Time: 06/05/16 11:40 AM  Result Value Ref Range   WBC 12.3 (H) 4.0 - 10.5 K/uL   RBC 4.56 3.87 - 5.11 MIL/uL   Hemoglobin 12.6 12.0 - 15.0 g/dL   HCT 37.3 36.0 -  46.0 %   MCV 81.8 78.0 - 100.0 fL   MCH 27.6 26.0 - 34.0 pg   MCHC 33.8 30.0 - 36.0 g/dL   RDW 14.8 11.5 - 15.5 %   Platelets 311 150 - 400 K/uL  Type and screen     Status: None   Collection Time: 06/05/16 12:17 PM  Result Value Ref Range   ABO/RH(D) O POS    Antibody Screen NEG    Sample Expiration 06/08/2016     US Ob Comp Less 14 Wks  Result Date: 06/05/2016 CLINICAL DATA:  35 year old pregnant female with intrauterine device and spotting and left  pelvic pain. Uncertain LMP. IUD was placed 1 year prior by report. EXAM: OBSTETRIC <14 WK [place] AND TRANSVAGINAL OB US TECHNIQUE: Both transabdominal and transvaginal ultrasound examinations were performed for complete evaluation of the gestation as well as the maternal uterus, adnexal regions, and pelvic cul-de-sac. Transvaginal technique was performed to assess early pregnancy. COMPARISON:  No prior scans from this gestation. FINDINGS: The anteverted uterus is enlarged and myomatous, overall measuring 14.0 x 5.9 x 9.6 cm, with representative fibroids as follows: - left anterior uterine body intramural partially calcified 5.3 x 5.3 x 4.9 cm fibroid - left lateral uterine body intramural partially calcified 3.5 x 3.6 x 3.9 cm fibroid - right posterior uterine body intramural 3.1 x 2.8 x 2.7 cm fibroid Intrauterine device appears well positioned within the fundal endometrial cavity, with no evidence of myometrial penetration by the side arms of the device. Bilayer endometrial thickness 9 mm. No endometrial cavity fluid, focal endometrial mass or intrauterine gestational sac demonstrated. Right ovary measures 6.8 x 2.9 by 3.6 cm and contains two simple appearing cysts, largest 3.8 x 3.3 x 2.6 cm. No suspicious right ovarian or right adnexal masses. Left ovary measures 3.3 x 2.2 x 2.8 cm and appears normal. There is a separate heterogeneous predominantly solid left adnexal mass located between the left ovary and uterus measuring 5.3 x 2.9 x 3.8 cm, demonstrating mild internal vascularity and tiny internal cystic spaces. No discrete embryo, yolk sac or embryonic cardiac activity are demonstrated within the left adnexal mass. There is trace free fluid in the left adnexa. IMPRESSION: 1. Heterogeneous predominantly solid 5.3 cm left adnexal mass located between and apparently separate from the left ovary and uterus, most consistent with a left tubal ectopic pregnancy. No embryo or yolk sac are seen within this left adnexal  mass. 2. Trace free fluid in the left adnexa. 3. No intrauterine gestation. IUD is well positioned within the endometrial cavity. 4. Enlarged myomatous uterus as described. These results were called by telephone at the time of interpretation on 06/05/2016 at 10:32 am to Dr. Alanda Slim Dacotah Cabello , who verbally acknowledged these results. Electronically Signed   By: Ilona Sorrel M.D.   On: 06/05/2016 10:36   US Ob Transvaginal  Result Date: 06/05/2016 CLINICAL DATA:  35 year old pregnant female with intrauterine device and spotting and left pelvic pain. Uncertain LMP. IUD was placed 1 year prior by report. EXAM: OBSTETRIC <14 WK [place] AND TRANSVAGINAL OB US TECHNIQUE: Both transabdominal and transvaginal ultrasound examinations were performed for complete evaluation of the gestation as well as the maternal uterus, adnexal regions, and pelvic cul-de-sac. Transvaginal technique was performed to assess early pregnancy. COMPARISON:  No prior scans from this gestation. FINDINGS: The anteverted uterus is enlarged and myomatous, overall measuring 14.0 x 5.9 x 9.6 cm, with representative fibroids as follows: - left anterior uterine body intramural partially calcified 5.3 x  5.3 x 4.9 cm fibroid - left lateral uterine body intramural partially calcified 3.5 x 3.6 x 3.9 cm fibroid - right posterior uterine body intramural 3.1 x 2.8 x 2.7 cm fibroid Intrauterine device appears well positioned within the fundal endometrial cavity, with no evidence of myometrial penetration by the side arms of the device. Bilayer endometrial thickness 9 mm. No endometrial cavity fluid, focal endometrial mass or intrauterine gestational sac demonstrated. Right ovary measures 6.8 x 2.9 by 3.6 cm and contains two simple appearing cysts, largest 3.8 x 3.3 x 2.6 cm. No suspicious right ovarian or right adnexal masses. Left ovary measures 3.3 x 2.2 x 2.8 cm and appears normal. There is a separate heterogeneous predominantly solid left adnexal mass located  between the left ovary and uterus measuring 5.3 x 2.9 x 3.8 cm, demonstrating mild internal vascularity and tiny internal cystic spaces. No discrete embryo, yolk sac or embryonic cardiac activity are demonstrated within the left adnexal mass. There is trace free fluid in the left adnexa. IMPRESSION: 1. Heterogeneous predominantly solid 5.3 cm left adnexal mass located between and apparently separate from the left ovary and uterus, most consistent with a left tubal ectopic pregnancy. No embryo or yolk sac are seen within this left adnexal mass. 2. Trace free fluid in the left adnexa. 3. No intrauterine gestation. IUD is well positioned within the endometrial cavity. 4. Enlarged myomatous uterus as described. These results were called by telephone at the time of interpretation on 06/05/2016 at 10:32 am to Dr. Alanda Slim Shawnay Bramel , who verbally acknowledged these results. Electronically Signed   By: Ilona Sorrel M.D.   On: 06/05/2016 10:36    Assessment/Plan: Presumed left ectopic Fibroid uterus Lengthy discussion with pt and husband regarding the IUD and the other tube w/r to permanent sterilization and removal of IUD vs left salpingectomy which is necessary for the management of the left ectopic. Pt advised removal of the other tube would be permanent sterilization. Alt BC disc including DMPA, nexplanon, pills. Disc IUD while in the correct place on sonogram and has prevented a pregnancy in the uterus, cannot prevent ectopic in the tube in general therefore she will still be at risk for ectopic if she were to conceive in the future and may still happen with the IUD. Agrees to leave current IUD until she has decided on alternative birth control. Partner declines vasectomy.  P) dx laparoscopy, left salpingectomy. Procedure explained. Risk of surgery reviewed including  Infection, bleeding, injury to surrounding organ structures, thermal injury. All ? answered  Eldor Conaway A 06/05/2016, 2:27 PM

## 2016-06-05 NOTE — Anesthesia Procedure Notes (Signed)
Procedure Name: Intubation Date/Time: 06/05/2016 4:14 PM Performed by: Hewitt Blade Pre-anesthesia Checklist: Patient identified, Emergency Drugs available, Suction available and Patient being monitored Patient Re-evaluated:Patient Re-evaluated prior to inductionOxygen Delivery Method: Circle system utilized Preoxygenation: Pre-oxygenation with 100% oxygen Intubation Type: IV induction, Rapid sequence and Cricoid Pressure applied Ventilation: Oral airway inserted - appropriate to patient size and Mask ventilation without difficulty Laryngoscope Size: Mac and 3 Grade View: Grade II Tube type: Oral Tube size: 7.0 mm Number of attempts: 2 Airway Equipment and Method: Stylet Placement Confirmation: ETT inserted through vocal cords under direct vision,  positive ETCO2 and breath sounds checked- equal and bilateral Secured at: 22 cm Tube secured with: Tape Dental Injury: Teeth and Oropharynx as per pre-operative assessment

## 2016-06-06 NOTE — Op Note (Signed)
Bridget Morales, Bridget Morales            ACCOUNT NO.:  [id number]  MEDICAL RECORD NO.:  [id number]  LOCATION:  [facility]                          FACILITY:  [facility]  PHYSICIAN:  Servando Salina, M.D.DATE OF BIRTH:  04-Aug-1981  DATE OF PROCEDURE:  06/05/2016 DATE OF DISCHARGE:  06/05/2016                              OPERATIVE REPORT   PREOPERATIVE DIAGNOSIS:  Left ectopic pregnancy, fibroid uterus.  PROCEDURE:  Diagnostic laparoscopy, left salpingectomy, and evacuation of hemoperitoneum.  POSTOPERATIVE DIAGNOSIS:  Ruptured left ectopic pregnancy, fibroid uterus.  ANESTHESIA:  General.  SURGEON:  Servando Salina, M.D.  ASSISTANT:  Laury Deep, CNM  DESCRIPTION OF PROCEDURE:  Under adequate general anesthesia, the patient was placed in the dorsal lithotomy position.  She was sterilely prepped and draped in usual fashion.  Indwelling Foley catheter was sterilely placed.  Bivalve speculum was placed in the vagina.  A single- tooth tenaculum was placed on the anterior lip of the cervix.  The IUD string was visible and the acorn cannula was introduced into the cervical os and attached to the tenaculum for manipulation of the uterus.  A bivalve speculum was then removed.  Attention was then turned to the abdomen.  A 0.25% Marcaine was injected infraumbilical and infraumbilical vertical incision was then made.  Veress needle was introduced.  Opening pressure 8 was noted.  A 2.6 L of CO2 was insufflated.  Veress needle was then removed.  A 10-11 mm trocar with sleeve was introduced to the abdomen without incident.  A lighted videolaparoscope was inserted.  At that point, it was then noted that there was blood in the pelvis consistent with a rupture of an ectopic. Panoramic inspection noted some upper abdomen hemoperitoneum as well. Additional port site was placed in the right and left lower quadrant.  A 10 mm port was placed in the left lower quadrant and right lower quadrant, a 5-mm port  was placed.  The patient was placed in Trendelenburg position and the pelvis was further inspected.  The right fallopian tube was seen.  The right ovary had an adjacent right ovarian cyst.  The uterus was quite enlarged fibroid in a dumbbell shape fashion, which limited the ability to lift it up from the vaginal side.  The blood was evacuated.  There was evidence of the ectopic with clotted material around the left ovary, with some distal pregnancy tissue from the tube.  The large clots from the pregnancy was removed from the posterior cul-de-sac, placed in the endobag and brought up through the left lower quadrant incision site.  The abdomen was then continued to be irrigated and pieces removed as well as those that were surrounding the left ovary confirmed that the ovary itself had not had a ruptured cyst.  Once that was ascertained, there were some peritoneal cysts in the posterior cul-de-sac on the right and in the anterior cul- de-sac that was noted and removed.  At that point, decision was then made to follow through with removal of the left fallopian tube and due to the lack of limited ability to mobilize the uterus.  An additional suprapubic port site was then placed to help facilitate the surgery. With grasping of the fallopian tube down the  underlying mesosalpinx was serially clamped, cauterized, and cut using the gyrus until the fallopian tube was removed and then brought into the field in the left lower quadrant.  The abdomen again was irrigated.  The blood in the upper abdomen was removed and normal appendix was noted.  The right fallopian tube was otherwise normal and the pelvic side had good hemostasis.  The patient was again inspected with the patient being taken out of Trendelenburg and the fluid removed.  Once there was evidence of good hemostasis throughout, the ports were removed under direct visualization.  The incisions were closed with 4-0 Vicryl subcuticular  closure and the fascia was closed with 0 Vicryl figure-of- eight sutures.  The instruments from the vagina was removed, in particular speculum was then reinserted and showed evidence of the IUD still in place.  Specimen was the left fallopian tube with adjacent ectopic pregnancy sent to Pathology.  Estimated blood loss was probably 5 mL.  Intraoperative hemoperitoneum was about 150-200 mL.  Sponge and instrument counts x2 was correct.  COMPLICATION:  None.  The patient tolerated the procedure well, was transferred to recovery in stable condition.     Servando Salina, M.D.     [place]/MEDQ  D:  06/05/2016  T:  06/06/2016  Job:  [id number]

## 2016-06-07 ENCOUNTER — Encounter (HOSPITAL_COMMUNITY): Payer: Self-pay | Admitting: Obstetrics and Gynecology

## 2016-06-07 NOTE — Addendum Note (Signed)
Addendum  created 06/07/16 2306 by Laverle Hobby, CRNA   Charge Capture section accepted

## 2016-06-12 ENCOUNTER — Encounter (HOSPITAL_COMMUNITY): Payer: Self-pay | Admitting: *Deleted

## 2016-06-12 ENCOUNTER — Inpatient Hospital Stay (HOSPITAL_COMMUNITY)
Admission: AD | Admit: 2016-06-12 | Discharge: 2016-06-13 | Disposition: A | Discharge status: Home / Self Care | Payer: BLUE CROSS/BLUE SHIELD | Source: Ambulatory Visit | Attending: Obstetrics and Gynecology | Admitting: Obstetrics and Gynecology

## 2016-06-12 DIAGNOSIS — Z90721 Acquired absence of ovaries, unilateral: Secondary | ICD-10-CM | POA: Insufficient documentation

## 2016-06-12 DIAGNOSIS — T819XXD Unspecified complication of procedure, subsequent encounter: Secondary | ICD-10-CM

## 2016-06-12 DIAGNOSIS — Z833 Family history of diabetes mellitus: Secondary | ICD-10-CM | POA: Insufficient documentation

## 2016-06-12 DIAGNOSIS — O089 Unspecified complication following an ectopic and molar pregnancy: Secondary | ICD-10-CM | POA: Insufficient documentation

## 2016-06-12 DIAGNOSIS — O135 Gestational [pregnancy-induced] hypertension without significant proteinuria, complicating the puerperium: Secondary | ICD-10-CM | POA: Insufficient documentation

## 2016-06-12 DIAGNOSIS — D259 Leiomyoma of uterus, unspecified: Secondary | ICD-10-CM | POA: Insufficient documentation

## 2016-06-12 DIAGNOSIS — O860 Infection of obstetric surgical wound: Secondary | ICD-10-CM | POA: Insufficient documentation

## 2016-06-12 DIAGNOSIS — Z803 Family history of malignant neoplasm of breast: Secondary | ICD-10-CM | POA: Insufficient documentation

## 2016-06-12 DIAGNOSIS — Z3A00 Weeks of gestation of pregnancy not specified: Secondary | ICD-10-CM | POA: Insufficient documentation

## 2016-06-12 NOTE — MAU Note (Addendum)
Had surgery last Friday for ectopic pregnancy. Seen if office yesterday and told had clot behind incision. Earlier today had some bright bleeding from incision but stopped. Few hours ago was having a lot of pain and "tightness" of incision and put ice pack on incision. When removed ice pack saw bright blood dripping from incision. Scant vag bleeding. PT started antibiotic yesterday but does not know the name

## 2016-06-13 DIAGNOSIS — Z90721 Acquired absence of ovaries, unilateral: Secondary | ICD-10-CM | POA: Diagnosis not present

## 2016-06-13 DIAGNOSIS — D259 Leiomyoma of uterus, unspecified: Secondary | ICD-10-CM | POA: Diagnosis not present

## 2016-06-13 DIAGNOSIS — O009 Unspecified ectopic pregnancy without intrauterine pregnancy: Secondary | ICD-10-CM | POA: Diagnosis present

## 2016-06-13 DIAGNOSIS — O860 Infection of obstetric surgical wound: Secondary | ICD-10-CM | POA: Diagnosis not present

## 2016-06-13 DIAGNOSIS — O135 Gestational [pregnancy-induced] hypertension without significant proteinuria, complicating the puerperium: Secondary | ICD-10-CM | POA: Diagnosis not present

## 2016-06-13 DIAGNOSIS — O089 Unspecified complication following an ectopic and molar pregnancy: Secondary | ICD-10-CM | POA: Diagnosis not present

## 2016-06-13 DIAGNOSIS — Z803 Family history of malignant neoplasm of breast: Secondary | ICD-10-CM | POA: Diagnosis not present

## 2016-06-13 DIAGNOSIS — Z833 Family history of diabetes mellitus: Secondary | ICD-10-CM | POA: Diagnosis not present

## 2016-06-13 DIAGNOSIS — Z3A00 Weeks of gestation of pregnancy not specified: Secondary | ICD-10-CM | POA: Diagnosis not present

## 2016-06-13 NOTE — Progress Notes (Signed)
Dr Garwin Brothers called at home and no answer

## 2016-06-13 NOTE — MAU Provider Note (Signed)
Chief Complaint:  Drainage from Incision   First Provider Initiated Contact with Patient 06/13/16 0024     HPI: Bridget Morales is a 35 y.o. [id number] who presents to maternity admissions reporting bleeding from incision.  Had surgery for an ectopic last Friday and has had redness around incision.  Was seen by Dr Garwin Brothers in office yesterday and area was marked.  Had bleeding from area this evening.. She reports no vaginal bleeding, vaginal itching/burning, urinary symptoms, h/a, dizziness, n/v, or fever/chills.    Other  This is a new problem. The current episode started today. The problem occurs intermittently. The problem has been gradually improving. Associated symptoms include abdominal pain. Pertinent negatives include no chills, fever, headaches, nausea, urinary symptoms or vertigo. She has tried ice for the symptoms.   RN Note: Had surgery last Friday for ectopic pregnancy. Seen if office yesterday and told had clot behind incision. Earlier today had some bright bleeding from incision but stopped. Few hours ago was having a lot of pain and "tightness" of incision and put ice pack on incision. When removed ice pack saw bright blood dripping from incision. Scant vag bleeding   Past Medical History: Past Medical History:  Diagnosis Date  . Fibroid   . Pregnancy induced hypertension   . Previous pregnancy complicated by pregnancy-induced hypertension, antepartum     Past obstetric history: OB History  Gravida Para Term Preterm AB Living  4 3 0 1 1 3   SAB TAB Ectopic Multiple Live Births      1   3    # Outcome Date GA Lbr Len/2nd Weight Sex Delivery Anes PTL Lv  4 Preterm 02/03/14 [redacted]w[redacted]d  6 lb 13.7 oz (3.11 kg) F Vag-Spont None  LIV     Birth Comments: within normal limits  3 Ectopic           2 Para     F Vag-Spont   LIV     Birth Comments: induced at 49 with elevated BP  1 Para     F Vag-Spont   LIV      Past Surgical History: Past Surgical History:  Procedure Laterality  Date  . LAPAROSCOPY N/A 06/05/2016   Procedure: LAPAROSCOPY DIAGNOSTIC;  Surgeon: Servando Salina, MD;  Location: [facility];  Service: Gynecology;  Laterality: N/A;  ectopic pregnancy   . UNILATERAL SALPINGECTOMY Left 06/05/2016   Procedure: UNILATERAL SALPINGECTOMY;  Surgeon: Servando Salina, MD;  Location: [facility];  Service: Gynecology;  Laterality: Left;  . WISDOM TOOTH EXTRACTION      Family History: Family History  Problem Relation Age of Onset  . Diabetes Father   . Diabetes Maternal Grandmother   . Cancer Maternal Grandmother     breast    Social History: Social History  Substance Use Topics  . Smoking status: Never Smoker  . Smokeless tobacco: Never Used  . Alcohol use No    Allergies: No Known Allergies  Meds:  Prescriptions Prior to Admission  Medication Sig Dispense Refill Last Dose  . ibuprofen (ADVIL,MOTRIN) 200 MG tablet Take 200 mg by mouth every 6 (six) hours as needed.    at 1900  . HYDROcodone-acetaminophen (NORCO/VICODIN) 5-325 MG tablet Take 1 tablet by mouth every 6 (six) hours as needed for moderate pain. 30 tablet 0   . ibuprofen (ADVIL,MOTRIN) 800 MG tablet Take 1 tablet (800 mg total) by mouth every 8 (eight) hours as needed for mild pain or moderate pain. 30 tablet 2     I have  reviewed patient's Past Medical Hx, Surgical Hx, Family Hx, Social Hx, medications and allergies.  ROS:  Review of Systems  Constitutional: Negative for chills and fever.  Gastrointestinal: Positive for abdominal pain. Negative for constipation, diarrhea and nausea.  Genitourinary: Negative for dysuria, vaginal bleeding and vaginal discharge.  Neurological: Negative for vertigo and headaches.   Other systems negative   Physical Exam  Patient Vitals for the past 24 hrs:  BP Temp Pulse Resp Height Weight  06/12/16 2323 163/86 98.6 F (37 C) 105 18 5\' 6"  (1.676 m) 217 lb (98.4 kg)   Constitutional: Well-developed, well-nourished female in no acute distress.    Cardiovascular: normal rate and rhythm, no ectopy audible, S1 & S2 heard, no murmur Respiratory: normal effort, no distress. Lungs CTAB with no wheezes or crackles GI: Abd soft, and tender around incision.  No rebound, No guarding.  Area around incision is indurated and erethematous, but not beyond markings. MS: Extremities nontender, no edema, normal ROM Neurologic: Alert and oriented x 4.   Grossly nonfocal. GU: Neg CVAT. Skin:  Warm and Dry Psych:  Affect appropriate.     PELVIC EXAM: Deferred    Labs: No results found for this or any previous visit (from the past 24 hour(s)). --/--/O POS (09/01 1217)  Imaging:   MAU Course/MDM: I have ordered labs as follows: none Imaging ordered: none   Consult Dr Ronita Hipps.  Marland Kitchen  He recommends routine wound care, keep clean and dry.  Call office Monday for Dr Garwin Brothers to reevaluate. Treatments in MAU included none.   Pt stable at time of discharge.  Assessment: Wound erethema with bleeding episode, now resolved  Plan: Discharge home Recommend keep wound clean and dry, observe for worsening  Encouraged to return here or to other Urgent Care/ED if she develops worsening of symptoms, increase in pain, fever, or other concerning symptoms.   Hansel Feinstein CNM, MSN Certified Nurse-Midwife 06/13/2016 12:30 AM

## 2016-06-13 NOTE — Discharge Instructions (Signed)
Dressing Change °A dressing is a material placed over wounds. It keeps the wound clean, dry, and protected from further injury. This provides an environment that favors wound healing.  °BEFORE YOU BEGIN °· Get your supplies together. Things you may need include: °¨ Saline solution. °¨ Flexible gauze dressing. °¨ Medicated cream. °¨ Tape. °¨ Gloves. °¨ Abdominal dressing pads. °¨ Gauze squares. °¨ Plastic bags. °· Take pain medicine 30 minutes before the dressing change if you need it. °· Take a shower before you do the first dressing change of the day. Use plastic wrap or a plastic bag to prevent the dressing from getting wet. °REMOVING YOUR OLD DRESSING  °· Wash your hands with soap and water. Dry your hands with a clean towel. °· Put on your gloves. °· Remove any tape. °· Carefully remove the old dressing. If the dressing sticks, you may dampen it with warm water to loosen it, or follow your caregiver's specific directions. °· Remove any gauze or packing tape that is in your wound. °· Take off your gloves. °· Put the gloves, tape, gauze, or any packing tape into a plastic bag. °CHANGING YOUR DRESSING °· Open the supplies. °· Take the cap off the saline solution. °· Open the gauze package so that the gauze remains on the inside of the package. °· Put on your gloves. °· Clean your wound as told by your caregiver. °· If you have been told to keep your wound dry, follow those instructions. °· Your caregiver may tell you to do one or more of the following: °¨ Pick up the gauze. Pour the saline solution over the gauze. Squeeze out the extra saline solution. °¨ Put medicated cream or other medicine on your wound if you have been told to do so. °¨ Put the solution soaked gauze only in your wound, not on the skin around it. °¨ Pack your wound loosely or as told by your caregiver. °¨ Put dry gauze on your wound. °¨ Put abdominal dressing pads over the dry gauze if your wet gauze soaks through. °· Tape the abdominal dressing  pads in place so they will not fall off. Do not wrap the tape completely around the affected part (arm, leg, abdomen). °· Wrap the dressing pads with a flexible gauze dressing to secure it in place. °· Take off your gloves. Put them in the plastic bag with the old dressing. Tie the bag shut and throw it away. °· Keep the dressing clean and dry until your next dressing change. °· Wash your hands. °SEEK MEDICAL CARE IF: °· Your skin around the wound looks red. °· Your wound feels more tender or sore. °· You see pus in the wound. °· Your wound smells bad. °· You have a fever. °· Your skin around the wound has a rash that itches and burns. °· You see black or yellow skin in your wound that was not there before. °· You feel nauseous, throw up, and feel very tired. °  °This information is not intended to replace advice given to you by your health care provider. Make sure you discuss any questions you have with your health care provider. °  °Document Released: 10/29/2004 Document Revised: 12/14/2011 Document Reviewed: 08/03/2011 °Elsevier Interactive Patient Education ©2016 [employer] ° °

## 2017-02-10 ENCOUNTER — Telehealth: Payer: Self-pay | Admitting: Family Medicine
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
## 2017-02-11 NOTE — Telephone Encounter (Signed)
LMOM TO CALL BACK ABOUT BALANCE

## 2017-07-14 IMAGING — US US OB COMP LESS 14 WK
1 series · 14 of 28 positions shown · non-contrast
Comparison: No prior scans from this gestation.

CLINICAL DATA: 34-year-old pregnant female with intrauterine device
and spotting and left pelvic pain. Uncertain LMP. IUD was placed 1
year prior by report.

EXAM:
OBSTETRIC <14 WK US AND TRANSVAGINAL OB US
TECHNIQUE: Both transabdominal and transvaginal ultrasound examinations were
performed for complete evaluation of the gestation as well as the
maternal uterus, adnexal regions, and pelvic cul-de-sac.
Transvaginal technique was performed to assess early pregnancy.

[Series 1: us ob comp less 14 wk · 14 of 106 slices shown]
[im 4/106]
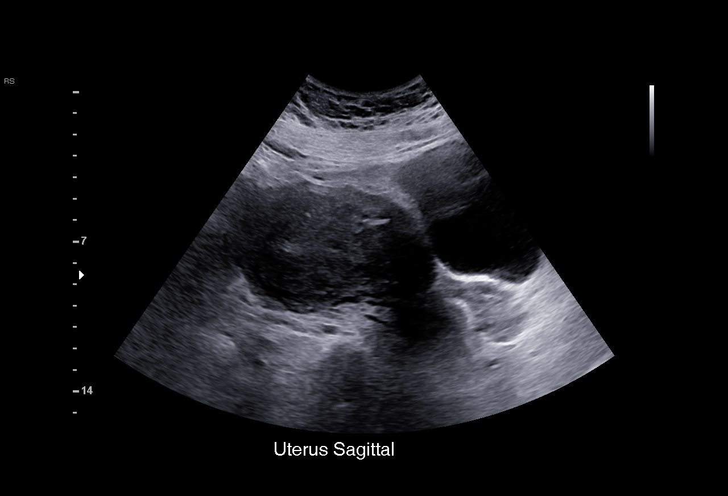
[im 12/106]
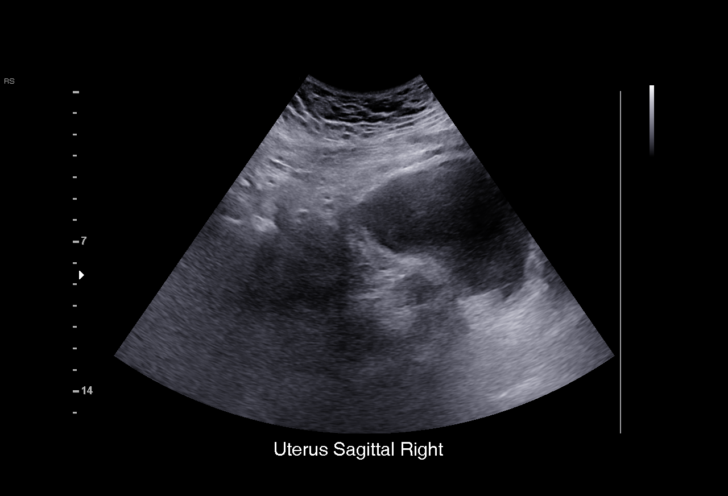
[im 20/106]
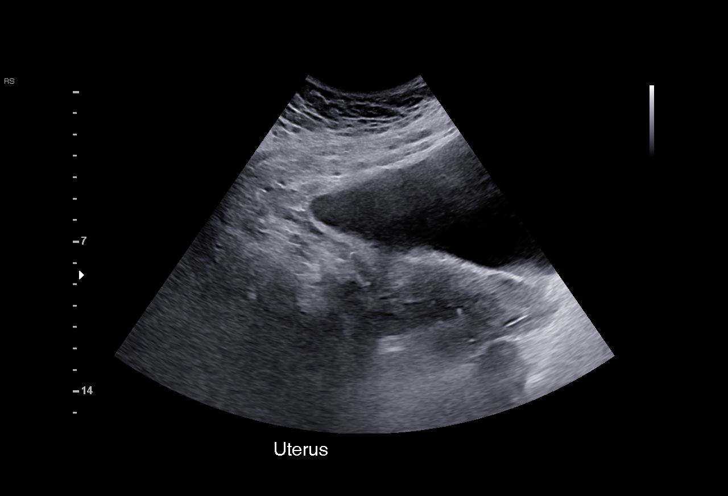
[im 28/106]
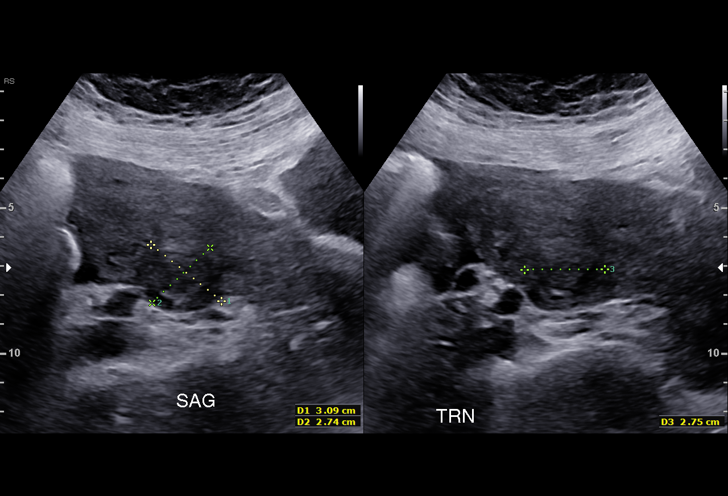
[im 36/106]
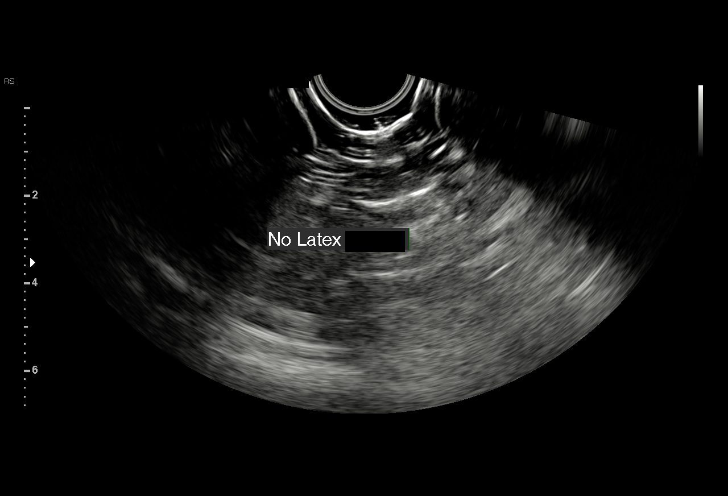
[im 43/106]
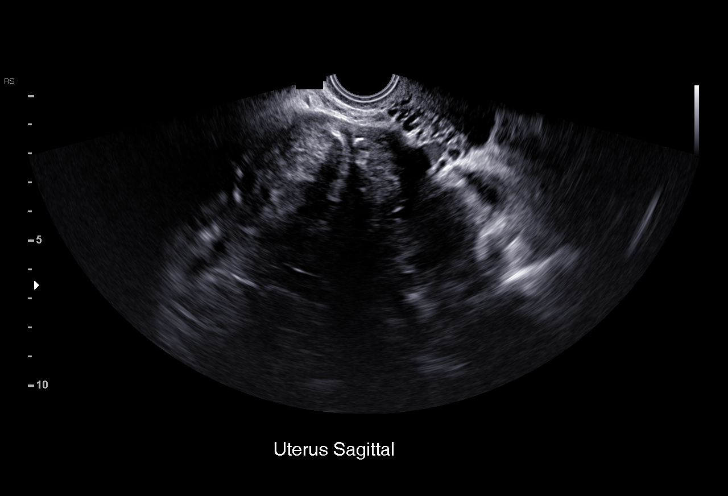
[im 51/106]
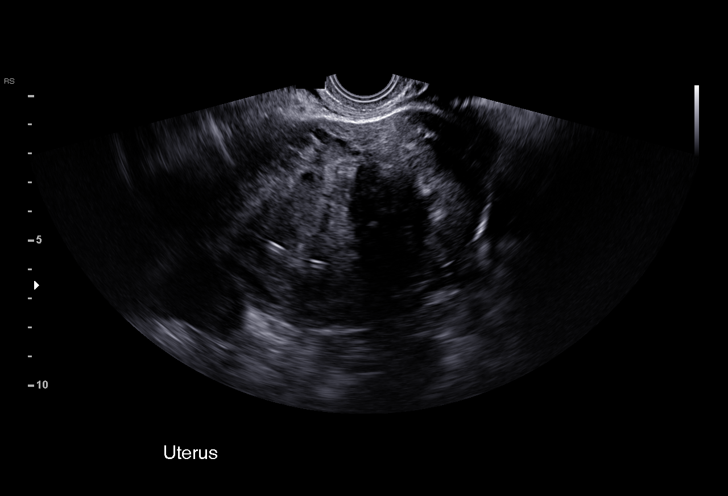
[im 59/106]
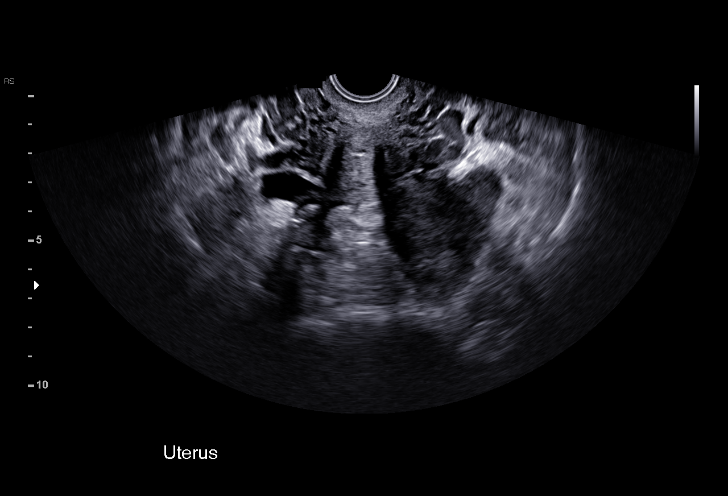
[im 67/106]
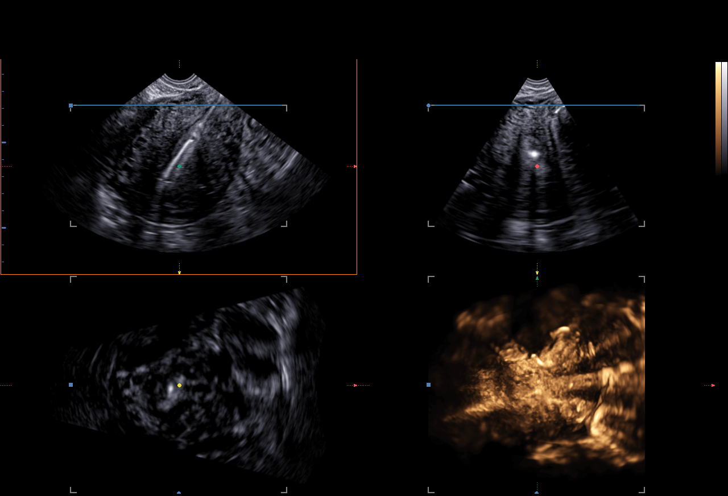
[im 74/106]
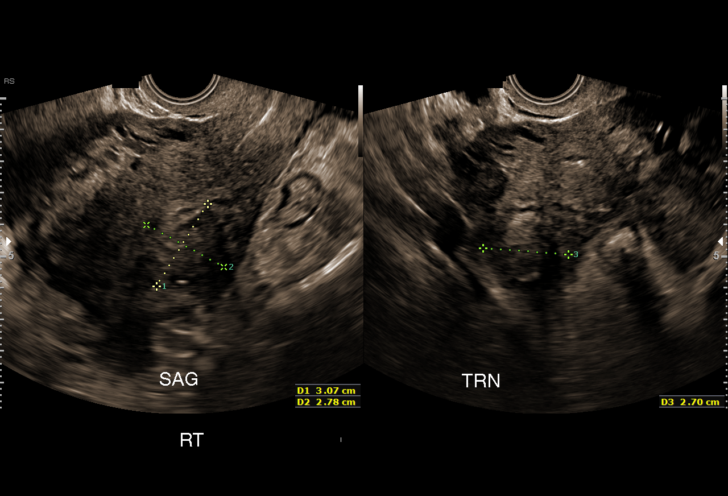
[im 82/106]
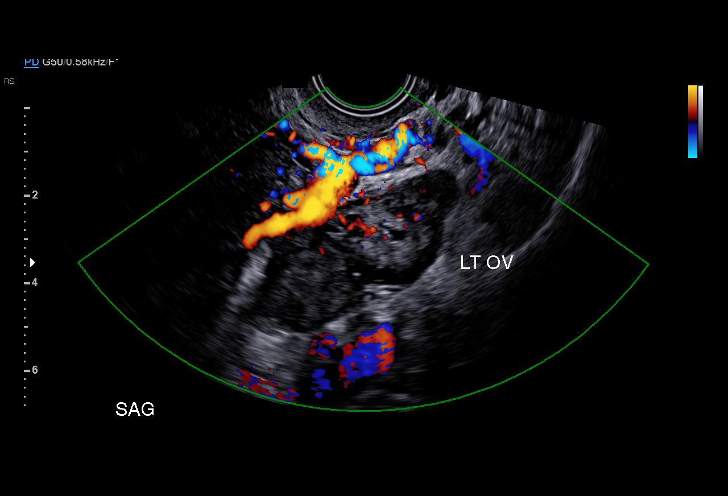
[im 90/106]
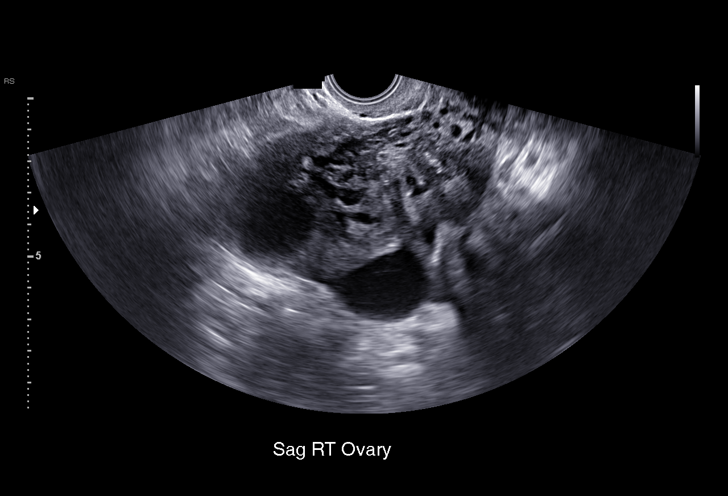
[im 98/106]
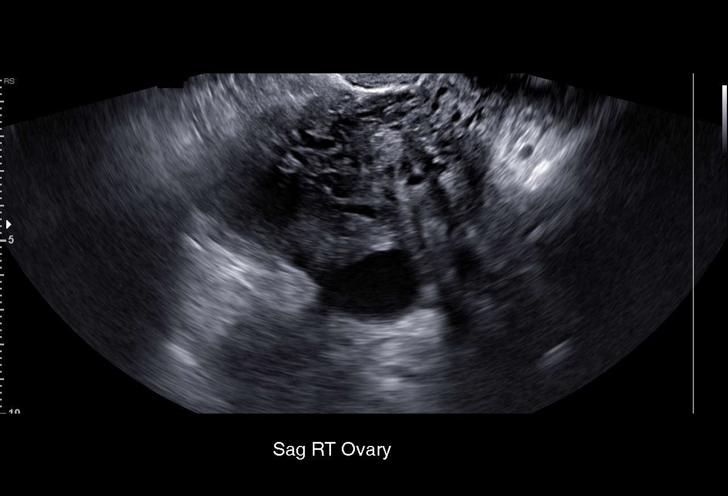
[im 106/106]
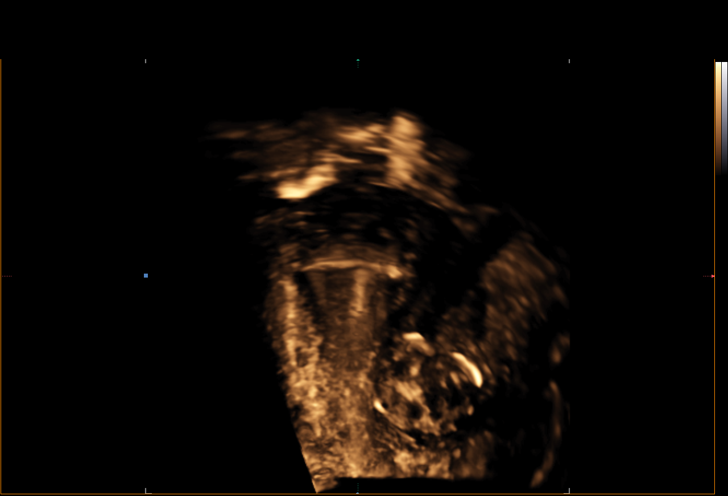

[14 of 28 positions shown; findings below may reference images not displayed]

FINDINGS: The anteverted uterus is enlarged and myomatous, overall measuring
14.0 x 5.9 x 9.6 cm, with representative fibroids as follows:

- left anterior uterine body intramural partially calcified 5.3 x
5.3 x 4.9 cm fibroid

- left lateral uterine body intramural partially calcified 3.5 x
x 3.9 cm fibroid

- right posterior uterine body intramural 3.1 x 2.8 x 2.7 cm fibroid

Intrauterine device appears well positioned within the fundal
endometrial cavity, with no evidence of myometrial penetration by
the side arms of the device. Bilayer endometrial thickness 9 mm. No
endometrial cavity fluid, focal endometrial mass or intrauterine
gestational sac demonstrated.

Right ovary measures 6.8 x 2.9 by 3.6 cm and contains two simple
appearing cysts, largest 3.8 x 3.3 x 2.6 cm. No suspicious right
ovarian or right adnexal masses.

Left ovary measures 3.3 x 2.2 x 2.8 cm and appears normal. There is
a separate heterogeneous predominantly solid left adnexal mass
located between the left ovary and uterus measuring 5.3 x 2.9 x
cm, demonstrating mild internal vascularity and tiny internal cystic
spaces. No discrete embryo, yolk sac or embryonic cardiac activity
are demonstrated within the left adnexal mass. There is trace free
fluid in the left adnexa.
IMPRESSION: 1. Heterogeneous predominantly solid 5.3 cm left adnexal mass
located between and apparently separate from the left ovary and
uterus, most consistent with a left tubal ectopic pregnancy. No
embryo or yolk sac are seen within this left adnexal mass.
2. Trace free fluid in the left adnexa.
3. No intrauterine gestation. IUD is well positioned within the
endometrial cavity.
4. Enlarged myomatous uterus as described.

These results were called by telephone at the time of interpretation
on 06/05/2016 at [DATE] to Dr. LIUBKA LATINI , who verbally
acknowledged these results.

## 2022-04-08 ENCOUNTER — Encounter (HOSPITAL_COMMUNITY): Payer: Self-pay | Admitting: Emergency Medicine

## 2022-04-08 ENCOUNTER — Other Ambulatory Visit: Payer: Self-pay

## 2022-04-08 ENCOUNTER — Emergency Department (HOSPITAL_COMMUNITY)
Admission: EM | Admit: 2022-04-08 | Discharge: 2022-04-08 | Disposition: A | Discharge status: Home / Self Care | Payer: 59 | Attending: Emergency Medicine | Admitting: Emergency Medicine

## 2022-04-08 DIAGNOSIS — H5789 Other specified disorders of eye and adnexa: Secondary | ICD-10-CM

## 2022-04-08 DIAGNOSIS — T65893A Toxic effect of other specified substances, assault, initial encounter: Secondary | ICD-10-CM | POA: Insufficient documentation

## 2022-04-08 MED ORDER — FLUORESCEIN SODIUM 1 MG OP STRP
1.0000 | ORAL_STRIP | Freq: Once | OPHTHALMIC | Status: AC
  Administered 2022-04-08: 1 via OPHTHALMIC
  Filled 2022-04-08: qty 1

## 2022-04-08 MED ORDER — TETRACAINE HCL 0.5 % OP SOLN
2.0000 [drp] | Freq: Once | OPHTHALMIC | Status: AC
  Administered 2022-04-08: 2 [drp] via OPHTHALMIC
  Filled 2022-04-08: qty 4
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
